# Patient Record
Sex: Female | Born: 1970
Health system: Southern US, Community
[De-identification: ages and names within clinical notes are randomized; demographics above are authoritative.]

## PROBLEM LIST (undated history)

## (undated) DIAGNOSIS — T7840XA Allergy, unspecified, initial encounter: Secondary | ICD-10-CM

## (undated) HISTORY — PX: BLADDER SURGERY: SHX569

## (undated) HISTORY — PX: ABDOMINAL HYSTERECTOMY: SHX81

## (undated) HISTORY — DX: Allergy, unspecified, initial encounter: T78.40XA

## (undated) HISTORY — PX: CHOLECYSTECTOMY: SHX55

## (undated) HISTORY — PX: OTHER SURGICAL HISTORY: SHX169

---

## 2010-01-10 ENCOUNTER — Ambulatory Visit: Payer: Self-pay | Admitting: Unknown Physician Specialty

## 2010-03-02 ENCOUNTER — Ambulatory Visit: Payer: Self-pay | Admitting: Unknown Physician Specialty

## 2010-05-31 ENCOUNTER — Ambulatory Visit: Payer: Self-pay | Admitting: Unknown Physician Specialty

## 2010-06-13 ENCOUNTER — Inpatient Hospital Stay: Payer: Self-pay | Admitting: Obstetrics & Gynecology

## 2010-06-14 LAB — PATHOLOGY REPORT

## 2013-12-21 LAB — HM PAP SMEAR

## 2018-03-13 ENCOUNTER — Encounter: Payer: Self-pay | Admitting: Maternal Newborn

## 2018-03-14 ENCOUNTER — Ambulatory Visit (INDEPENDENT_AMBULATORY_CARE_PROVIDER_SITE_OTHER): Payer: BLUE CROSS/BLUE SHIELD | Admitting: Maternal Newborn

## 2018-03-14 ENCOUNTER — Other Ambulatory Visit (HOSPITAL_COMMUNITY)
Admission: RE | Admit: 2018-03-14 | Discharge: 2018-03-14 | Disposition: A | Discharge status: Home / Self Care | Payer: BLUE CROSS/BLUE SHIELD | Source: Ambulatory Visit | Attending: Maternal Newborn | Admitting: Maternal Newborn

## 2018-03-14 ENCOUNTER — Encounter: Payer: Self-pay | Admitting: Maternal Newborn

## 2018-03-14 VITALS — BP 108/76 | HR 96 | Ht 62.0 in | Wt 162.0 lb

## 2018-03-14 DIAGNOSIS — R5383 Other fatigue: Secondary | ICD-10-CM | POA: Diagnosis not present

## 2018-03-14 DIAGNOSIS — Z1329 Encounter for screening for other suspected endocrine disorder: Secondary | ICD-10-CM | POA: Diagnosis not present

## 2018-03-14 DIAGNOSIS — Z124 Encounter for screening for malignant neoplasm of cervix: Secondary | ICD-10-CM | POA: Insufficient documentation

## 2018-03-14 DIAGNOSIS — Z01419 Encounter for gynecological examination (general) (routine) without abnormal findings: Secondary | ICD-10-CM

## 2018-03-14 DIAGNOSIS — N760 Acute vaginitis: Secondary | ICD-10-CM

## 2018-03-14 DIAGNOSIS — Z130 Encounter for screening for diseases of the blood and blood-forming organs and certain disorders involving the immune mechanism: Secondary | ICD-10-CM | POA: Diagnosis not present

## 2018-03-14 DIAGNOSIS — Z1231 Encounter for screening mammogram for malignant neoplasm of breast: Secondary | ICD-10-CM

## 2018-03-14 NOTE — Progress Notes (Signed)
Gynecology Annual Exam  PCP: Patient, No Pcp Per  Chief Complaint:  Chief Complaint  Patient presents with  . Gynecologic Exam    hot flashes, discharge, no odor, very itchy and burning running to rectal x months ago    History of Present Illness: Patient is a 48 y.o. presenting for an  annual exam. She has had some discharge and itching and painful intercourse.  LMP: No LMP recorded. (Menstrual status: Other). She has had a hysterectomy. No vaginal bleeding.  The patient is sexually active. She currently uses status post hysterectomy for contraception. She has dyspareunia.  The patient does perform self breast exams.  There is no notable family history of breast or ovarian cancer in her family.  The patient has regular exercise: no.    The patient reports current symptoms of depression.    Review of Systems  Constitutional: Positive for malaise/fatigue.  HENT: Negative.   Eyes: Negative.   Respiratory: Negative for shortness of breath and wheezing.   Cardiovascular: Negative for chest pain and palpitations.  Gastrointestinal: Negative.   Genitourinary:       Dyspareunia Vaginal discharge  Musculoskeletal: Positive for joint pain and myalgias.  Skin: Positive for itching.  Neurological: Positive for tingling and headaches.       Sciatic nerve pain on the left side with tingling in her feet/toes  Endo/Heme/Allergies: Positive for environmental allergies. Bruises/bleeds easily.       Hot flashes  Psychiatric/Behavioral: Positive for depression. The patient is not nervous/anxious.   All other systems reviewed and are negative.   Past Medical History:  History reviewed. No pertinent past medical history.  Past Surgical History:  Past Surgical History:  Procedure Laterality Date  . BLADDER SURGERY     bladder sling  . CHOLECYSTECTOMY    . OTHER SURGICAL HISTORY     Hysterectomy, just uterus removed;heavy bleeding    Gynecologic History:  No LMP recorded.  (Menstrual status: Other). Contraception: status post hysterectomy Last Pap: 12/21/2013. Results were: NILM Last mammogram: Unknown, record shows 02/2010 BI-RADS-2 Obstetric History: No obstetric history on file.  Family History:  Family History  Problem Relation Age of Onset  . Depression Mother   . Cancer Father 71       Family history of lung cancer  . Colon polyps Sister   . Depression Sister   . Cancer Maternal Grandfather 16       Family history of lung cancer    Social History:  Social History   Socioeconomic History  . Marital status: Married    Spouse name: Not on file  . Number of children: Not on file  . Years of education: Not on file  . Highest education level: Not on file  Occupational History  . Not on file  Social Needs  . Financial resource strain: Not on file  . Food insecurity:    Worry: Not on file    Inability: Not on file  . Transportation needs:    Medical: Not on file    Non-medical: Not on file  Tobacco Use  . Smoking status: Never Smoker  . Smokeless tobacco: Never Used  Substance and Sexual Activity  . Alcohol use: Yes    Comment: occ  . Drug use: Never  . Sexual activity: Yes    Birth control/protection: Surgical    Comment: partial hysterectomy  Lifestyle  . Physical activity:    Days per week: Not on file    Minutes per session: Not on file  .  Stress: Not on file  Relationships  . Social connections:    Talks on phone: Not on file    Gets together: Not on file    Attends religious service: Not on file    Active member of club or organization: Not on file    Attends meetings of clubs or organizations: Not on file    Relationship status: Not on file  . Intimate partner violence:    Fear of current or ex partner: Not on file    Emotionally abused: Not on file    Physically abused: Not on file    Forced sexual activity: Not on file  Other Topics Concern  . Not on file  Social History Narrative  . Not on file     Allergies:  No Known Allergies  Medications: Prior to Admission medications   Not on File    Physical Exam Vitals: Blood pressure 108/76, pulse 96, height 5\' 2"  (1.575 m), weight 162 lb (73.5 kg).  General: NAD HEENT: normocephalic, anicteric Thyroid: no enlargement, no palpable nodules Pulmonary: No increased work of breathing, CTAB Cardiovascular: RRR, no murmurs, rubs, or gallops Breast: Breasts symmetrical, no tenderness, no palpable nodules or masses, no skin or nipple retraction present, no nipple discharge.  No axillary or supraclavicular lymphadenopathy. Abdomen: Soft, non-tender, non-distended.  Umbilicus without lesions.  No hepatomegaly, splenomegaly or masses palpable. No evidence of hernia  Genitourinary:  External: Mild erythema on labia minora, otherwise normal  external female genitalia.  Normal urethral meatus, normal  Bartholin's and Skene's glands.    Vagina: Normal vaginal mucosa, no evidence of prolapse.  No abnormal discharge seen.   Cervix: Unable to visualize  Uterus: Surgically absent  Adnexa: ovaries non-enlarged, no adnexal masses  Rectal: deferred  Lymphatic: no evidence of inguinal lymphadenopathy Extremities: no edema, erythema, or tenderness Neurologic: Grossly intact Psychiatric: mood appropriate, affect full  Assessment: 48 y.o. here for an annual exam, check for BV and yeast infection.  Plan: Problem List Items Addressed This Visit    None    Visit Diagnoses    Women's annual routine gynecological examination    -  Primary   Relevant Orders   CBC (Completed)   TSH (Completed)   Screening for iron deficiency anemia       Relevant Orders   CBC (Completed)   Fatigue, unspecified type       Relevant Orders   TSH (Completed)   Screening for thyroid disorder       Relevant Orders   TSH (Completed)   Breast cancer screening by mammogram       Relevant Orders   MM 3D SCREEN BREAST BILATERAL   Pap smear for cervical cancer screening        Relevant Orders   Cytology - PAP   Acute vaginitis       Relevant Orders   Cytology - PAP      1) Mammogram - recommend yearly screening mammogram.  Mammogram was ordered today. She is aware to schedule with  [facility]; card given.  2) STI screening was offered and declined.  3) ASCCP guidelines and rationale discussed.  Patient opts for every 3 year screening interval.  4) Contraception - She has had a hysterectomy.  5) Colonoscopy -- Plans to see a PCP and declines referral today for colonoscopy.  6) CBC and TSH ordered today for frequent fatigue.  7) She has a history of depression, not currently on medication and feels that her symptoms are under control. Is  not interested in interventions at this time.  8) Check for yeast and BV with Pap.  Marcelyn BruinsJacelyn Jiro Kiester, CNM 03/14/2018

## 2018-03-15 DIAGNOSIS — M5416 Radiculopathy, lumbar region: Secondary | ICD-10-CM | POA: Diagnosis not present

## 2018-03-15 LAB — CBC
Hematocrit: 39.1 % (ref 34.0–46.6)
Hemoglobin: 13.8 g/dL (ref 11.1–15.9)
MCH: 30.1 pg (ref 26.6–33.0)
MCHC: 35.3 g/dL (ref 31.5–35.7)
MCV: 85 fL (ref 79–97)
PLATELETS: 228 10*3/uL (ref 150–450)
RBC: 4.58 x10E6/uL (ref 3.77–5.28)
RDW: 12.5 % (ref 11.7–15.4)
WBC: 8.2 10*3/uL (ref 3.4–10.8)

## 2018-03-15 LAB — TSH: TSH: 2.15 u[IU]/mL (ref 0.450–4.500)

## 2018-03-17 ENCOUNTER — Encounter: Payer: Self-pay | Admitting: Maternal Newborn

## 2018-03-18 LAB — CYTOLOGY - PAP
BACTERIAL VAGINITIS: POSITIVE — AB
Candida vaginitis: NEGATIVE
DIAGNOSIS: NEGATIVE
HPV: NOT DETECTED

## 2018-03-19 ENCOUNTER — Other Ambulatory Visit: Payer: Self-pay | Admitting: Maternal Newborn

## 2018-03-19 DIAGNOSIS — B9689 Other specified bacterial agents as the cause of diseases classified elsewhere: Secondary | ICD-10-CM

## 2018-03-19 DIAGNOSIS — N760 Acute vaginitis: Principal | ICD-10-CM

## 2018-03-19 MED ORDER — METRONIDAZOLE 500 MG PO TABS: 500.0000 mg | ORAL_TABLET | Freq: Two times a day (BID) | ORAL | 0 refills | Status: AC

## 2018-03-19 NOTE — Progress Notes (Signed)
Rx sent for Flagyl

## 2018-04-04 ENCOUNTER — Other Ambulatory Visit: Payer: Self-pay | Admitting: Maternal Newborn

## 2018-04-04 DIAGNOSIS — B379 Candidiasis, unspecified: Secondary | ICD-10-CM

## 2018-04-04 MED ORDER — FLUCONAZOLE 150 MG PO TABS: 150.0000 mg | ORAL_TABLET | Freq: Once | ORAL | 0 refills | Status: AC

## 2018-04-04 NOTE — Progress Notes (Signed)
Rx for Diflucan 

## 2018-04-28 DIAGNOSIS — M5416 Radiculopathy, lumbar region: Secondary | ICD-10-CM | POA: Diagnosis not present

## 2018-05-02 DIAGNOSIS — M6281 Muscle weakness (generalized): Secondary | ICD-10-CM | POA: Diagnosis not present

## 2018-05-02 DIAGNOSIS — M545 Low back pain: Secondary | ICD-10-CM | POA: Diagnosis not present

## 2018-05-06 DIAGNOSIS — M545 Low back pain: Secondary | ICD-10-CM | POA: Diagnosis not present

## 2018-05-06 DIAGNOSIS — M6281 Muscle weakness (generalized): Secondary | ICD-10-CM | POA: Diagnosis not present

## 2018-09-02 ENCOUNTER — Other Ambulatory Visit: Payer: Self-pay | Admitting: Maternal Newborn

## 2018-09-02 DIAGNOSIS — L290 Pruritus ani: Secondary | ICD-10-CM

## 2018-09-02 MED ORDER — HYDROCORTISONE (PERIANAL) 1 % EX CREA: 1.0000 "application " | TOPICAL_CREAM | Freq: Two times a day (BID) | CUTANEOUS | 0 refills | Status: DC

## 2018-09-02 NOTE — Progress Notes (Signed)
Rx for hydrocortisone cream.

## 2018-09-03 ENCOUNTER — Other Ambulatory Visit: Payer: Self-pay | Admitting: Maternal Newborn

## 2018-09-03 DIAGNOSIS — L290 Pruritus ani: Secondary | ICD-10-CM

## 2018-09-03 MED ORDER — TRIAMCINOLONE ACETONIDE 0.1 % EX CREA: 1.0000 "application " | TOPICAL_CREAM | Freq: Two times a day (BID) | CUTANEOUS | 1 refills | Status: DC

## 2019-01-06 ENCOUNTER — Other Ambulatory Visit: Payer: Self-pay | Admitting: Maternal Newborn

## 2019-01-06 DIAGNOSIS — Z1231 Encounter for screening mammogram for malignant neoplasm of breast: Secondary | ICD-10-CM

## 2019-02-17 ENCOUNTER — Other Ambulatory Visit: Payer: Self-pay

## 2019-02-17 ENCOUNTER — Ambulatory Visit (INDEPENDENT_AMBULATORY_CARE_PROVIDER_SITE_OTHER): Payer: BLUE CROSS/BLUE SHIELD | Admitting: Internal Medicine

## 2019-02-17 ENCOUNTER — Encounter: Payer: Self-pay | Admitting: Internal Medicine

## 2019-02-17 VITALS — BP 108/76 | HR 67 | Temp 97.6°F | Ht 61.0 in | Wt 157.0 lb

## 2019-02-17 DIAGNOSIS — K219 Gastro-esophageal reflux disease without esophagitis: Secondary | ICD-10-CM

## 2019-02-17 DIAGNOSIS — F339 Major depressive disorder, recurrent, unspecified: Secondary | ICD-10-CM

## 2019-02-17 DIAGNOSIS — L409 Psoriasis, unspecified: Secondary | ICD-10-CM

## 2019-02-17 DIAGNOSIS — R8290 Unspecified abnormal findings in urine: Secondary | ICD-10-CM

## 2019-02-17 DIAGNOSIS — N390 Urinary tract infection, site not specified: Secondary | ICD-10-CM | POA: Diagnosis not present

## 2019-02-17 DIAGNOSIS — J302 Other seasonal allergic rhinitis: Secondary | ICD-10-CM | POA: Diagnosis not present

## 2019-02-17 DIAGNOSIS — M5432 Sciatica, left side: Secondary | ICD-10-CM | POA: Insufficient documentation

## 2019-02-17 LAB — POC URINALSYSI DIPSTICK (AUTOMATED)
Bilirubin, UA: NEGATIVE
Blood, UA: NEGATIVE
Glucose, UA: NEGATIVE
Ketones, UA: POSITIVE
Leukocytes, UA: NEGATIVE
Nitrite, UA: POSITIVE
Protein, UA: POSITIVE — AB
Spec Grav, UA: >=1.03 — AB (ref 1.010–1.025)
Urobilinogen, UA: 0.2 E.U./dL
pH, UA: 5 (ref 5.0–8.0)

## 2019-02-17 MED ORDER — NITROFURANTOIN MONOHYD MACRO 100 MG PO CAPS: 100.0000 mg | ORAL_CAPSULE | Freq: Two times a day (BID) | ORAL | 0 refills | Status: DC

## 2019-02-17 MED ORDER — CLOBETASOL PROPIONATE 0.05 % EX CREA: 1.0000 "application " | TOPICAL_CREAM | Freq: Two times a day (BID) | CUTANEOUS | 0 refills | Status: DC

## 2019-02-17 MED ORDER — CLOBETASOL PROPIONATE 0.05 % EX SOLN: 1.0000 "application " | Freq: Every day | CUTANEOUS | 4 refills | Status: DC

## 2019-02-17 MED ORDER — SERTRALINE HCL 50 MG PO TABS: 50.0000 mg | ORAL_TABLET | Freq: Every day | ORAL | 2 refills | Status: DC

## 2019-02-17 NOTE — Assessment & Plan Note (Signed)
PHQ9 score of 3 Support offered today Will restart Sertraline 50 mg PO daily  Update me in 4-6 weeks via mychart and let me know how you are doing

## 2019-02-17 NOTE — Assessment & Plan Note (Signed)
RX for Clobetasol 0.05% cream BID RX for Clobetasol 0.05% external solution for scalp

## 2019-02-17 NOTE — Assessment & Plan Note (Signed)
Try to avoid foods that trigger your reflux, and eating late at night Continue Famotidine OTC as needed

## 2019-02-17 NOTE — Assessment & Plan Note (Signed)
Continue Zyrtec and Flonase OTC

## 2019-02-17 NOTE — Progress Notes (Signed)
HPI  Pt presents to the clinic today to establish care and for management of the conditions listed below. She has not had a PCP in many years, but has been following with GYN:  Seasonal Allergies: Worse in the Spring. She takes Zyrtec and Flonase as needed with good relief.  GERD: Triggered by eating late at night, onions, spicy and tomato based foods. She takes Famotidine OTC as needed with good relief. There is no upper GI on file.   She also c/o rash to scalp, back, upper chest, left elbow, vaginal area/perineum. These areas itch and then hurt after scratching. She has tried Hydrocortisone and Triamcinolone cream without any relief.   Depression: She feels like this is being triggered by having a difficult year. She was really sick with a cold back in the fall. Then she developed shingles, then this rash that won't go away. She also reports having issues with her sciatic nerve. She reports she was on Sertraline in the past, but went off of it secondary decreased libido. She would like to restart Sertraline at this time. She is not currently seeing a therapist. She denies anxiety, SI/HI.  Sciatica: Left side. She reports some numbness in her toes. She stretches daily and takes NSAID's OTC. She was doing PT in the past which helped but stopped due to the expense.  Flu: never Tetanus: > 10 years ago Pap Smear: 03/2018, [facility] Mammogram: scheduled 04/2019 at [facility] Colon Screening: never Vision Screening: as needed Dentist: as needed  Past Medical History:  Diagnosis Date  . Allergy     Current Outpatient Medications  Medication Sig Dispense Refill  . cetirizine (ZYRTEC) 10 MG tablet Take 10 mg by mouth daily.    . famotidine (PEPCID) 20 MG tablet Take 20 mg by mouth as needed.     Marland Kitchen ibuprofen (ADVIL) 200 MG tablet Take 200 mg by mouth daily as needed.    . naproxen sodium (ALEVE) 220 MG tablet Take 220 mg by mouth daily as needed.     No current facility-administered medications  for this visit.    No Known Allergies  Family History  Problem Relation Age of Onset  . Depression Mother   . Alzheimer's disease Mother   . Lung cancer Father   . Colon polyps Sister   . Depression Sister   . Lung cancer Maternal Grandfather   . Depression Sister     Social History   Socioeconomic History  . Marital status: Married    Spouse name: Not on file  . Number of children: Not on file  . Years of education: Not on file  . Highest education level: Not on file  Occupational History  . Not on file  Tobacco Use  . Smoking status: Current Some Day Smoker    Types: Cigarettes  . Smokeless tobacco: Never Used  Substance and Sexual Activity  . Alcohol use: Yes    Comment: occasional  . Drug use: Never  . Sexual activity: Yes    Birth control/protection: Surgical    Comment: partial hysterectomy  Other Topics Concern  . Not on file  Social History Narrative  . Not on file   Social Determinants of Health   Financial Resource Strain:   . Difficulty of Paying Living Expenses: Not on file  Food Insecurity:   . Worried About Programme researcher, broadcasting/film/video in the Last Year: Not on file  . Ran Out of Food in the Last Year: Not on file  Transportation Needs:   .  Lack of Transportation (Medical): Not on file  . Lack of Transportation (Non-Medical): Not on file  Physical Activity:   . Days of Exercise per Week: Not on file  . Minutes of Exercise per Session: Not on file  Stress:   . Feeling of Stress : Not on file  Social Connections:   . Frequency of Communication with Friends and Family: Not on file  . Frequency of Social Gatherings with Friends and Family: Not on file  . Attends Religious Services: Not on file  . Active Member of Clubs or Organizations: Not on file  . Attends Archivist Meetings: Not on file  . Marital Status: Not on file  Intimate Partner Violence:   . Fear of Current or Ex-Partner: Not on file  . Emotionally Abused: Not on file  .  Physically Abused: Not on file  . Sexually Abused: Not on file    ROS:  Constitutional: Pt reports intermittent sinus headaches. Denies fever, malaise, fatigue, or abrupt weight changes.  HEENT: Denies eye pain, eye redness, ear pain, ringing in the ears, wax buildup, runny nose, nasal congestion, bloody nose, or sore throat. Respiratory: Denies difficulty breathing, shortness of breath, cough or sputum production.   Cardiovascular: Denies chest pain, chest tightness, palpitations or swelling in the hands or feet.  Gastrointestinal: Denies abdominal pain, bloating, constipation, diarrhea or blood in the stool.  GU: Pt reports urine odor. Denies frequency, urgency, pain with urination, blood in urine, or discharge. Musculoskeletal: Pt reports chronic sciatica. Denies decrease in range of motion, difficulty with gait, muscle pain or joint pain and swelling.  Skin: Pt reports rash. Denies ulcercations.  Neurological: Denies dizziness, difficulty with memory, difficulty with speech or problems with balance and coordination.  Psych: Pt reports depression. Denies anxiety, SI/HI.  No other specific complaints in a complete review of systems (except as listed in HPI above).  PE:   BP 108/76   Pulse 67   Temp 97.6 F (36.4 C) (Temporal)   Ht 5\' 1"  (1.549 m)   Wt 157 lb (71.2 kg)   SpO2 98%   BMI 29.66 kg/m   Wt Readings from Last 3 Encounters:  03/14/18 162 lb (73.5 kg)    General: Appears her stated age, well developed, well nourished in NAD. Skin: White scaly plaque note of posterior scalp, right upper back and left elbow. She also reports similar rash of the vaginal/perineal area. HEENT: Head: normal shape and size; Eyes: sclera white, no icterus, conjunctiva pink, PERRLA and EOMs intact;  Cardiovascular: Normal rate and rhythm. S1,S2 noted.  No murmur, rubs or gallops noted.  Pulmonary/Chest: Normal effort and positive vesicular breath sounds. No respiratory distress. No wheezes,  rales or ronchi noted.  Abdomen: Soft and nontender. Normal bowel sounds. Musculoskeletal: No difficulty with gait.  Neurological: Alert and oriented.  Psychiatric: Mood and affect normal. Behavior is normal. Judgment and thought content normal.     Assessment and Plan:  Urine Odor:  Urinalysis: trace leuks, pos nitrites Will send urine culture Push fluids Ok to take AZO OTC RX for Macrobid 100 mg BID x 5 days  Webb Silversmith, NP This visit occurred during the SARS-CoV-2 public health emergency.  Safety protocols were in place, including screening questions prior to the visit, additional usage of staff PPE, and extensive cleaning of exam room while observing appropriate contact time as indicated for disinfecting solutions.

## 2019-02-17 NOTE — Assessment & Plan Note (Signed)
Continue stretching Continue NSAID's OTC Consider seeing a chiropractor

## 2019-02-17 NOTE — Addendum Note (Signed)
Addended by: Lurlean Nanny on: 02/17/2019 09:52 AM   Modules accepted: Orders

## 2019-02-17 NOTE — Patient Instructions (Signed)
Psoriasis Psoriasis is a long-term (chronic) skin condition. It occurs because your body's defense system (immune system) causes skin cells to form too quickly. This causes raised, red patches (plaques) on your skin that look silvery. The patches may be on all areas of your body. They can be any size or shape. Psoriasis can come and go. It can range from mild to very bad. It cannot be passed from one person to another (is not contagious). There is no cure for this condition, but it can be helped with treatment. What are the causes? The cause of psoriasis is not known. Some things can make it worse. These are:  Skin damage, such as cuts, scrapes, sunburn, and dryness.  Not getting enough sunlight.  Some medicines.  Alcohol.  Tobacco.  Stress.  Infections. What increases the risk?  Having a family member with psoriasis.  Being very overweight (obese).  Being 20-40 years old.  Taking certain medicines. What are the signs or symptoms? There are different types of psoriasis. The types are:  Plaque. This is the most common. Symptoms include red, raised patches with a silvery coating. These may be itchy. Your nails may be crumbly or fall off.  Guttate. Symptoms include small red spots on your stomach area, arms, and legs. These may happen after you have been sick, such as with strep throat.  Inverse. Symptoms include patches in your armpits, under your breasts, private areas, or on your butt.  Pustular. Symptoms include pus-filled bumps on the palms of your hands or the soles of your feet. You also may feel very tired, weak, have a fever, and not be hungry.  Erythrodermic. Symptoms include bright red skin that looks burned. You may have a fast heartbeat and a body temperature that is too high or too low. You may be itchy or in pain.  Sebopsoriasis. Symptoms include red patches on your scalp, forehead, and face that are greasy.  Psoriatic arthritis. Symptoms include swollen,  painful joints along with scaly skin patches. How is this treated? There is no cure for this condition, but treatment can:  Help your skin heal.  Lessen itching and irritation and swelling (inflammation).  Slow the growth of new skin cells.  Help your body's defense system respond better to your skin. Treatment may include:  Creams or ointments.  Light therapy. This may include natural sunlight or light therapy in a doctor's office.  Medicines. These can help your body better manage skin cells. They may be used with light therapy or ointments. Medicines may include pills or injections. You may also get antibiotic medicines if you have an infection. Follow these instructions at home: Skin Care  Apply lotion to your skin as needed. Only use those that your doctor has said are okay.  Apply cool, wet cloths (cold compresses) to the affected areas.  Do not use a hot tub or take hot showers. Use slightly warm, not hot, water when taking showers and baths.  Do not scratch your skin. Lifestyle   Do not use any products that contain nicotine or tobacco, such as cigarettes, e-cigarettes, and chewing tobacco. If you need help quitting, ask your doctor.  Lower your stress.  Keep a healthy weight.  Go out in the sun as told by your doctor. Do not get sunburned.  Join a support group. Medicines  Take or use over-the-counter and prescription medicines only as told by your doctor.  If you were prescribed an antibiotic medicine, take it as told by your doctor.   Do not stop using the antibiotic even if you start to feel better. Alcohol use If you drink alcohol:  Limit how much you use: ? 0-1 drink a day for women. ? 0-2 drinks a day for men.  Be aware of how much alcohol is in your drink. In the [place] one drink equals one 12 oz bottle of beer (355 mL), one 5 oz glass of wine (148 mL), or one 1 oz glass of hard liquor (44 mL). General instructions  Keep a journal to track the  things that cause symptoms (triggers). Try to avoid these things.  See a counselor if you feel the support would help.  Keep all follow-up visits as told by your doctor. This is important. Contact a doctor if:  You have a fever.  Your pain gets worse.  You have more redness or warmth in the affected areas.  You have new or worse pain or stiffness in your joints.  Your nails start to break easily or pull away from the nail bed.  You feel very sad (depressed). Summary  Psoriasis is a long-term (chronic) skin condition.  There is no cure for this condition, but treatment can help manage it.  Keep a journal to track the things that cause symptoms.  Take or use over-the-counter and prescription medicines only as told by your doctor.  Keep all follow-up visits as told by your doctor. This is important. This information is not intended to replace advice given to you by your health care provider. Make sure you discuss any questions you have with your health care provider. Document Released: 03/29/2004 Document Revised: 12/24/2017 Document Reviewed: 12/24/2017 Elsevier Patient Education  2020 [employer]  

## 2019-02-19 LAB — URINE CULTURE
MICRO NUMBER:: 1199393
SPECIMEN QUALITY:: ADEQUATE

## 2019-05-02 ENCOUNTER — Other Ambulatory Visit: Payer: Self-pay | Admitting: Internal Medicine

## 2019-05-04 MED ORDER — CLOBETASOL PROPIONATE 0.05 % EX CREA: 1.0000 "application " | TOPICAL_CREAM | Freq: Two times a day (BID) | CUTANEOUS | 0 refills | Status: DC

## 2019-05-18 ENCOUNTER — Ambulatory Visit: Payer: BLUE CROSS/BLUE SHIELD | Admitting: Internal Medicine

## 2019-05-25 ENCOUNTER — Encounter: Payer: Self-pay | Admitting: Internal Medicine

## 2019-05-25 ENCOUNTER — Ambulatory Visit: Payer: BLUE CROSS/BLUE SHIELD | Admitting: Internal Medicine

## 2019-07-01 ENCOUNTER — Encounter: Payer: Self-pay | Admitting: Internal Medicine

## 2019-07-22 ENCOUNTER — Ambulatory Visit (INDEPENDENT_AMBULATORY_CARE_PROVIDER_SITE_OTHER): Payer: BC Managed Care – PPO | Admitting: Internal Medicine

## 2019-07-22 ENCOUNTER — Encounter: Payer: Self-pay | Admitting: Internal Medicine

## 2019-07-22 ENCOUNTER — Other Ambulatory Visit: Payer: Self-pay

## 2019-07-22 VITALS — BP 116/70 | HR 100 | Temp 97.8°F | Ht 61.0 in | Wt 156.0 lb

## 2019-07-22 DIAGNOSIS — R198 Other specified symptoms and signs involving the digestive system and abdomen: Secondary | ICD-10-CM

## 2019-07-22 DIAGNOSIS — Z0000 Encounter for general adult medical examination without abnormal findings: Secondary | ICD-10-CM | POA: Diagnosis not present

## 2019-07-22 DIAGNOSIS — F419 Anxiety disorder, unspecified: Secondary | ICD-10-CM

## 2019-07-22 DIAGNOSIS — Z1211 Encounter for screening for malignant neoplasm of colon: Secondary | ICD-10-CM

## 2019-07-22 DIAGNOSIS — F339 Major depressive disorder, recurrent, unspecified: Secondary | ICD-10-CM | POA: Diagnosis not present

## 2019-07-22 DIAGNOSIS — F329 Major depressive disorder, single episode, unspecified: Secondary | ICD-10-CM | POA: Diagnosis not present

## 2019-07-22 MED ORDER — BUPROPION HCL ER (XL) 150 MG PO TB24: 150.0000 mg | ORAL_TABLET | Freq: Every day | ORAL | 0 refills | Status: DC

## 2019-07-22 NOTE — Progress Notes (Signed)
Subjective:    Patient ID: Rebecca Buchanan, female    DOB: Feb 05, 1971, 49 y.o.   MRN: [id number]  HPI  Pt presents to the clinic today for her annual exam.  Anxiety and Depression: She reports she stopped Sertraline due to side effects. She does feel like her anxiety has been worse recently. She has failed Citalopram, Venlafaxine in the past. She is not seeing a therapist. She denies SI/HI.  Flu: never Tetanus: > 10 years ago Covid: never Pap Smear: 03/2018 Mammogram: 2015 Colon Screening: never Vision Screening: as needed Dentist: as needed  Diet: She does eat meat. She consumes fruits and veggies daily. She does eat some fried foods. She drinks mostly sweet tea, some water. Exercise: Walking  Review of Systems  Past Medical History:  Diagnosis Date  . Allergy     Current Outpatient Medications  Medication Sig Dispense Refill  . cetirizine (ZYRTEC) 10 MG tablet Take 10 mg by mouth daily.    . clobetasol (TEMOVATE) 0.05 % external solution Apply 1 application topically daily. 240 mL 4  . clobetasol cream (TEMOVATE) 0.05 % Apply 1 application topically 2 (two) times daily. 30 g 0  . famotidine (PEPCID) 20 MG tablet Take 20 mg by mouth as needed.     Marland Kitchen ibuprofen (ADVIL) 200 MG tablet Take 200 mg by mouth daily as needed.    . naproxen sodium (ALEVE) 220 MG tablet Take 220 mg by mouth daily as needed.    . nitrofurantoin, macrocrystal-monohydrate, (MACROBID) 100 MG capsule Take 1 capsule (100 mg total) by mouth 2 (two) times daily. 10 capsule 0  . sertraline (ZOLOFT) 50 MG tablet Take 1 tablet (50 mg total) by mouth daily. 30 tablet 2   No current facility-administered medications for this visit.    No Known Allergies  Family History  Problem Relation Age of Onset  . Depression Mother   . Alzheimer's disease Mother   . Lung cancer Father   . Colon polyps Sister   . Depression Sister   . Lung cancer Maternal Grandfather   . Depression Sister     Social History    Socioeconomic History  . Marital status: Married    Spouse name: Not on file  . Number of children: Not on file  . Years of education: Not on file  . Highest education level: Not on file  Occupational History  . Not on file  Tobacco Use  . Smoking status: Current Some Day Smoker    Types: Cigarettes  . Smokeless tobacco: Never Used  Substance and Sexual Activity  . Alcohol use: Yes    Comment: occasional  . Drug use: Never  . Sexual activity: Yes    Birth control/protection: Surgical    Comment: partial hysterectomy  Other Topics Concern  . Not on file  Social History Narrative  . Not on file   Social Determinants of Health   Financial Resource Strain:   . Difficulty of Paying Living Expenses:   Food Insecurity:   . Worried About Programme researcher, broadcasting/film/video in the Last Year:   . Barista in the Last Year:   Transportation Needs:   . Freight forwarder (Medical):   Marland Kitchen Lack of Transportation (Non-Medical):   Physical Activity:   . Days of Exercise per Week:   . Minutes of Exercise per Session:   Stress:   . Feeling of Stress :   Social Connections:   . Frequency of Communication with Friends and  Family:   . Frequency of Social Gatherings with Friends and Family:   . Attends Religious Services:   . Active Member of Clubs or Organizations:   . Attends Archivist Meetings:   Marland Kitchen Marital Status:   Intimate Partner Violence:   . Fear of Current or Ex-Partner:   . Emotionally Abused:   Marland Kitchen Physically Abused:   . Sexually Abused:      Constitutional: Pt reports sinus headaches. Denies fever, malaise, fatigue, or abrupt weight changes.  HEENT: Denies eye pain, eye redness, ear pain, ringing in the ears, wax buildup, runny nose, nasal congestion, bloody nose, or sore throat. Respiratory: Denies difficulty breathing, shortness of breath, cough or sputum production.   Cardiovascular: Denies chest pain, chest tightness, palpitations or swelling in the hands or  feet.  Gastrointestinal: Pt reports alternating constipation and diarrhea. Denies abdominal pain, diarrhea or blood in the stool.  GU: Denies urgency, frequency, pain with urination, burning sensation, blood in urine, odor or discharge. Musculoskeletal: Denies decrease in range of motion, difficulty with gait, muscle pain or joint pain and swelling.  Skin: Denies redness, rashes, lesions or ulcercations.  Neurological: Denies dizziness, difficulty with memory, difficulty with speech or problems with balance and coordination.  Psych: Pt has a history of anxiety and depression. Denies SI/HI.  No other specific complaints in a complete review of systems (except as listed in HPI above).     Objective:   Physical Exam BP 116/70   Pulse 100   Temp 97.8 F (36.6 C) (Temporal)   Ht 5\' 1"  (1.549 m)   Wt 156 lb (70.8 kg)   SpO2 98%   BMI 29.48 kg/m   Wt Readings from Last 3 Encounters:  02/17/19 157 lb (71.2 kg)  03/14/18 162 lb (73.5 kg)    General: Appears her stated age, well developed, well nourished in NAD. Skin: Warm, dry and intact. No rashesnoted. HEENT: Head: normal shape and size; Eyes: sclera white, no icterus, conjunctiva pink, PERRLA and EOMs intact;  Neck:  Neck supple, trachea midline. No masses, lumps or thyromegaly present.  Cardiovascular: Normal rate and rhythm. S1,S2 noted.  No murmur, rubs or gallops noted. No JVD or BLE edema. No carotid bruits noted. Pulmonary/Chest: Normal effort and positive vesicular breath sounds. No respiratory distress. No wheezes, rales or ronchi noted.  Abdomen: Soft and nontender. Normal bowel sounds. No distention or masses noted. Liver, spleen and kidneys non palpable. Musculoskeletal: Strength 5/5 BUE/BLE. No difficulty with gait.  Neurological: Alert and oriented. Cranial nerves II-XII grossly intact. Coordination normal.  Psychiatric: Mood and affect normal. Behavior is normal. Judgment and thought content normal.            Assessment & Plan:   Preventative Health Maintenance:  Encouraged her to get a flu shot in the fall Tdap today Pap smear UTD Mammogram has been ordered- she will call to schedule Referral to GI for screening colonoscopy Encouraged her to consume a balanced diet and exercise regimen Advised her to see an eye doctor and dentist annually Will check CBC, CMET, Lipid and Vit D today  RTC in 1 year, sooner if needed Webb Silversmith, NP This visit occurred during the SARS-CoV-2 public health emergency.  Safety protocols were in place, including screening questions prior to the visit, additional usage of staff PPE, and extensive cleaning of exam room while observing appropriate contact time as indicated for disinfecting solutions.

## 2019-07-22 NOTE — Assessment & Plan Note (Signed)
Will d/c Sertraline Will trial Wellbutrin, RX sent to pharmacy Support offered  Update me in 1 month via mychart and let me know how you are doing

## 2019-07-22 NOTE — Patient Instructions (Signed)

## 2019-07-22 NOTE — Addendum Note (Signed)
Addended by: Roena Malady on: 07/22/2019 03:59 PM   Modules accepted: Orders

## 2019-07-23 LAB — LIPID PANEL
Cholesterol: 264 mg/dL — ABNORMAL HIGH (ref 0–200)
HDL: 75.5 mg/dL (ref >39.00)
LDL Cholesterol: 168 mg/dL — ABNORMAL HIGH (ref 0–99)
NonHDL: 188.83
Total CHOL/HDL Ratio: 4
Triglycerides: 104 mg/dL (ref 0.0–149.0)
VLDL: 20.8 mg/dL (ref 0.0–40.0)

## 2019-07-23 LAB — CBC
HCT: 38.9 % (ref 36.0–46.0)
Hemoglobin: 13.3 g/dL (ref 12.0–15.0)
MCHC: 34.1 g/dL (ref 30.0–36.0)
MCV: 88.6 fl (ref 78.0–100.0)
Platelets: 205 10*3/uL (ref 150.0–400.0)
RBC: 4.4 Mil/uL (ref 3.87–5.11)
RDW: 13 % (ref 11.5–15.5)
WBC: 7 10*3/uL (ref 4.0–10.5)

## 2019-07-23 LAB — COMPREHENSIVE METABOLIC PANEL
ALT: 24 U/L (ref 0–35)
AST: 24 U/L (ref 0–37)
Albumin: 4.6 g/dL (ref 3.5–5.2)
Alkaline Phosphatase: 98 U/L (ref 39–117)
BUN: 12 mg/dL (ref 6–23)
CO2: 30 mEq/L (ref 19–32)
Calcium: 9.8 mg/dL (ref 8.4–10.5)
Chloride: 105 mEq/L (ref 96–112)
Creatinine, Ser: 0.94 mg/dL (ref 0.40–1.20)
GFR: 62.69 mL/min (ref >60.00)
Glucose, Bld: 92 mg/dL (ref 70–99)
Potassium: 4.1 mEq/L (ref 3.5–5.1)
Sodium: 139 mEq/L (ref 135–145)
Total Bilirubin: 0.4 mg/dL (ref 0.2–1.2)
Total Protein: 7.1 g/dL (ref 6.0–8.3)

## 2019-07-23 LAB — VITAMIN D 25 HYDROXY (VIT D DEFICIENCY, FRACTURES): VITD: 28.91 ng/mL — ABNORMAL LOW (ref 30.00–100.00)

## 2019-07-24 ENCOUNTER — Encounter: Payer: Self-pay | Admitting: Internal Medicine

## 2019-07-28 ENCOUNTER — Encounter: Payer: Self-pay | Admitting: Internal Medicine

## 2019-07-28 MED ORDER — VALACYCLOVIR HCL 500 MG PO TABS: ORAL_TABLET | ORAL | 0 refills | Status: DC

## 2019-09-14 ENCOUNTER — Other Ambulatory Visit: Payer: Self-pay

## 2019-09-14 ENCOUNTER — Emergency Department
Admission: EM | Admit: 2019-09-14 | Discharge: 2019-09-14 | Disposition: A | Discharge status: Home / Self Care | Payer: BC Managed Care – PPO | Attending: Emergency Medicine | Admitting: Emergency Medicine

## 2019-09-14 ENCOUNTER — Encounter: Payer: Self-pay | Admitting: Emergency Medicine

## 2019-09-14 ENCOUNTER — Emergency Department: Payer: BC Managed Care – PPO

## 2019-09-14 DIAGNOSIS — Z9049 Acquired absence of other specified parts of digestive tract: Secondary | ICD-10-CM | POA: Diagnosis not present

## 2019-09-14 DIAGNOSIS — N201 Calculus of ureter: Secondary | ICD-10-CM | POA: Insufficient documentation

## 2019-09-14 DIAGNOSIS — N132 Hydronephrosis with renal and ureteral calculous obstruction: Secondary | ICD-10-CM | POA: Diagnosis not present

## 2019-09-14 DIAGNOSIS — R102 Pelvic and perineal pain: Secondary | ICD-10-CM | POA: Diagnosis not present

## 2019-09-14 DIAGNOSIS — N210 Calculus in bladder: Secondary | ICD-10-CM | POA: Diagnosis not present

## 2019-09-14 LAB — URINALYSIS, COMPLETE (UACMP) WITH MICROSCOPIC: Specific Gravity, Urine: 1.025 (ref 1.005–1.030)

## 2019-09-14 LAB — CBC
HCT: 36.9 % (ref 36.0–46.0)
Hemoglobin: 12.9 g/dL (ref 12.0–15.0)
MCH: 30.1 pg (ref 26.0–34.0)
MCHC: 35 g/dL (ref 30.0–36.0)
MCV: 86 fL (ref 80.0–100.0)
Platelets: 216 10*3/uL (ref 150–400)
RBC: 4.29 MIL/uL (ref 3.87–5.11)
RDW: 13 % (ref 11.5–15.5)
WBC: 11.2 10*3/uL — ABNORMAL HIGH (ref 4.0–10.5)
nRBC: 0 % (ref 0.0–0.2)

## 2019-09-14 LAB — BASIC METABOLIC PANEL
Anion gap: 11 (ref 5–15)
BUN: 13 mg/dL (ref 6–20)
CO2: 24 mmol/L (ref 22–32)
Calcium: 9.2 mg/dL (ref 8.9–10.3)
Chloride: 105 mmol/L (ref 98–111)
Creatinine, Ser: 0.95 mg/dL (ref 0.44–1.00)
GFR calc Af Amer: >60 mL/min (ref >60)
GFR calc non Af Amer: >60 mL/min (ref >60)
Glucose, Bld: 128 mg/dL — ABNORMAL HIGH (ref 70–99)
Potassium: 3.6 mmol/L (ref 3.5–5.1)
Sodium: 140 mmol/L (ref 135–145)

## 2019-09-14 MED ORDER — ONDANSETRON HCL 4 MG/2ML IJ SOLN
4.0000 mg | Freq: Once | INTRAMUSCULAR | Status: AC
  Administered 2019-09-14: 4 mg via INTRAVENOUS
  Filled 2019-09-14: qty 2

## 2019-09-14 MED ORDER — NAPROXEN 500 MG PO TABS
500.0000 mg | ORAL_TABLET | Freq: Two times a day (BID) | ORAL | 2 refills | Status: DC
Start: 2019-09-14 — End: 2021-02-06

## 2019-09-14 MED ORDER — TAMSULOSIN HCL 0.4 MG PO CAPS
0.4000 mg | ORAL_CAPSULE | Freq: Every day | ORAL | 0 refills | Status: DC
Start: 2019-09-14 — End: 2021-02-06

## 2019-09-14 MED ORDER — MORPHINE SULFATE (PF) 2 MG/ML IV SOLN
2.0000 mg | Freq: Once | INTRAVENOUS | Status: AC
  Administered 2019-09-14: 2 mg via INTRAVENOUS
  Filled 2019-09-14: qty 1

## 2019-09-14 MED ORDER — ONDANSETRON 4 MG PO TBDP: 4.0000 mg | ORAL_TABLET | Freq: Three times a day (TID) | ORAL | 0 refills | Status: DC | PRN

## 2019-09-14 MED ORDER — CEPHALEXIN 500 MG PO CAPS: 500.0000 mg | ORAL_CAPSULE | Freq: Two times a day (BID) | ORAL | 0 refills | Status: DC

## 2019-09-14 MED ORDER — HYDROCODONE-ACETAMINOPHEN 5-325 MG PO TABS: 1.0000 | ORAL_TABLET | Freq: Four times a day (QID) | ORAL | 0 refills | Status: DC | PRN

## 2019-09-14 MED ORDER — SODIUM CHLORIDE 0.9 % IV SOLN
1.0000 g | Freq: Once | INTRAVENOUS | Status: AC
  Administered 2019-09-14: 1 g via INTRAVENOUS
  Filled 2019-09-14: qty 10

## 2019-09-14 MED ORDER — OXYCODONE-ACETAMINOPHEN 5-325 MG PO TABS
1.0000 | ORAL_TABLET | Freq: Once | ORAL | Status: AC
  Administered 2019-09-14: 1 via ORAL
  Filled 2019-09-14: qty 1

## 2019-09-14 MED ORDER — KETOROLAC TROMETHAMINE 30 MG/ML IJ SOLN
30.0000 mg | Freq: Once | INTRAMUSCULAR | Status: AC
  Administered 2019-09-14: 30 mg via INTRAVENOUS
  Filled 2019-09-14: qty 1

## 2019-09-14 NOTE — ED Triage Notes (Signed)
Pt in via POV, sent over from [facility] for evaluation of pelvic pain and right flank pain x a few days, worsening today w/ some N/V developing as well.  Pt appears to be in acute pain.    Reports taking AZO and increasing water intake without any relief.

## 2019-09-14 NOTE — ED Notes (Signed)
POCT NEGATIVE  

## 2019-09-14 NOTE — ED Triage Notes (Signed)
First Nurse Note:  Arrives from [facility] for ED evaluation for pelvic and back pain.    Patient is AAOx3.  Skin warm and dry. NAD

## 2019-09-14 NOTE — ED Provider Notes (Signed)
[facility] Emergency Department Provider Note   ____________________________________________    I have reviewed the triage vital signs and the nursing notes.   HISTORY  Chief Complaint Flank Pain and Pelvic Pain     HPI Rebecca Buchanan is a 49 y.o. female who presents with complaints of right flank pain and right lower quadrant pain which she reports was quite severe and at times worse than childbirth.  She also reports over the last week while she was at the beach she had urinary tract infection symptoms, she has had urinary tract infections many times in the past, she took Azo as typical.  Denies fevers or chills.  She became concerned when pain worsened over the last day.  She has had nausea.  Past Medical History:  Diagnosis Date  . Allergy     Patient Active Problem List   Diagnosis Date Noted  . Seasonal allergies 02/17/2019  . GERD (gastroesophageal reflux disease) 02/17/2019  . Psoriasis 02/17/2019  . Depression, recurrent (HCC) 02/17/2019  . Sciatica of left side 02/17/2019    Past Surgical History:  Procedure Laterality Date  . ABDOMINAL HYSTERECTOMY     partial  . BLADDER SURGERY     bladder sling  . CHOLECYSTECTOMY    . OTHER SURGICAL HISTORY     Hysterectomy, just uterus removed;heavy bleeding    Prior to Admission medications   Medication Sig Start Date End Date Taking? Authorizing Provider  buPROPion (WELLBUTRIN XL) 150 MG 24 hr tablet Take 1 tablet (150 mg total) by mouth daily. 07/22/19   Lorre Munroe, NP  cetirizine (ZYRTEC) 10 MG tablet Take 10 mg by mouth daily.    [provider]  clobetasol (TEMOVATE) 0.05 % external solution Apply 1 application topically daily. 02/17/19   Lorre Munroe, NP  clobetasol cream (TEMOVATE) 0.05 % Apply 1 application topically 2 (two) times daily. 05/04/19   Lorre Munroe, NP  famotidine (PEPCID) 20 MG tablet Take 20 mg by mouth as needed.     [provider]    ibuprofen (ADVIL) 200 MG tablet Take 200 mg by mouth daily as needed.    [provider]  naproxen sodium (ALEVE) 220 MG tablet Take 220 mg by mouth daily as needed.    [provider]  valACYclovir (VALTREX) 500 MG tablet 1 tab po BID x 3 days during outbreak 07/28/19   Lorre Munroe, NP     Allergies Patient has no known allergies.  Family History  Problem Relation Age of Onset  . Depression Mother   . Alzheimer's disease Mother   . Lung cancer Father   . Colon polyps Sister   . Depression Sister   . Lung cancer Maternal Grandfather   . Depression Sister     Social History Social History   Tobacco Use  . Smoking status: Never Smoker  . Smokeless tobacco: Never Used  Vaping Use  . Vaping Use: Never used  Substance Use Topics  . Alcohol use: Yes  . Drug use: Never    Review of Systems  Constitutional: No fever Eyes: No visual changes.  ENT: No sore throat. Cardiovascular: Denies chest pain. Respiratory: Denies shortness of breath. Gastrointestinal: As above Genitourinary: As above Musculoskeletal: Negative for back pain. Skin: Negative for rash. Neurological: Negative for headaches or weakness   ____________________________________________   PHYSICAL EXAM:  VITAL SIGNS: ED Triage Vitals  Enc Vitals Group     BP 09/14/19 1829 136/71  Pulse Rate 09/14/19 1829 64     Resp 09/14/19 1829 20     Temp 09/14/19 1829 97.8 F (36.6 C)     Temp Source 09/14/19 1829 Oral     SpO2 09/14/19 1829 100 %     Weight 09/14/19 1829 70.8 kg (156 lb)     Height 09/14/19 1829 1.549 m (5\' 1" )     Head Circumference --      Peak Flow --      Pain Score 09/14/19 1846 9     Pain Loc --      Pain Edu? --      Excl. in GC? --     Constitutional: Alert and oriented.  Nose: No congestion/rhinnorhea. Mouth/Throat: Mucous membranes are moist.    Cardiovascular: Normal rate, regular rhythm.   Good peripheral circulation. Respiratory: Normal  respiratory effort.  No retractions.. Gastrointestinal: Soft and nontender. No distention.  Mild right CVA tenderness  Musculoskeletal: .  Warm and well perfused Neurologic:  Normal speech and language. No gross focal neurologic deficits are appreciated.  Skin:  Skin is warm, dry and intact. No rash noted. Psychiatric: Mood and affect are normal. Speech and behavior are normal.  ____________________________________________   LABS (all labs ordered are listed, but only abnormal results are displayed)  Labs Reviewed  URINALYSIS, COMPLETE (UACMP) WITH MICROSCOPIC - Abnormal; Notable for the following components:      Result Value   Color, Urine ORANGE (*)    APPearance TURBID (*)    Glucose, UA   (*)    Value: TEST NOT REPORTED DUE TO COLOR INTERFERENCE OF URINE PIGMENT   Hgb urine dipstick   (*)    Value: TEST NOT REPORTED DUE TO COLOR INTERFERENCE OF URINE PIGMENT   Bilirubin Urine   (*)    Value: TEST NOT REPORTED DUE TO COLOR INTERFERENCE OF URINE PIGMENT   Ketones, ur   (*)    Value: TEST NOT REPORTED DUE TO COLOR INTERFERENCE OF URINE PIGMENT   Protein, ur   (*)    Value: TEST NOT REPORTED DUE TO COLOR INTERFERENCE OF URINE PIGMENT   Nitrite   (*)    Value: TEST NOT REPORTED DUE TO COLOR INTERFERENCE OF URINE PIGMENT   Leukocytes,Ua   (*)    Value: TEST NOT REPORTED DUE TO COLOR INTERFERENCE OF URINE PIGMENT   Bacteria, UA RARE (*)    All other components within normal limits  BASIC METABOLIC PANEL - Abnormal; Notable for the following components:   Glucose, Bld 128 (*)    All other components within normal limits  CBC - Abnormal; Notable for the following components:   WBC 11.2 (*)    All other components within normal limits   ____________________________________________  EKG  None ____________________________________________  RADIOLOGY  CT renal stone study consistent with 4 to 5 mm distal right ureteral  calculus ____________________________________________   PROCEDURES  Procedure(s) performed: No  Procedures   Critical Care performed: No ____________________________________________   INITIAL IMPRESSION / ASSESSMENT AND PLAN / ED COURSE  Pertinent labs & imaging results that were available during my care of the patient were reviewed by me and considered in my medical decision making (see chart for details).  Patient presents with right flank pain as described above.  Differential includes pyelonephritis, ureterolithiasis, UTI  Lab work notable for mild elevation of white blood cell count, BMP is normal.  Pending urinalysis  Treated with IV Toradol and IV Zofran with near total resolution of pain  CT abdomen pelvis demonstrates 4 mm distal UVJ stone  Urinalysis Is limited by color interference of urine pigment likely secondary to Azo  Given her symptoms of UTI and kidney stone will treat with IV Rocephin and discuss with urology  Dr. Lonna Cobb will follow up with patient in office, appropriate for discharge with antibiotics, strict return precautions   ____________________________________________   FINAL CLINICAL IMPRESSION(S) / ED DIAGNOSES  Final diagnoses:  Ureterolithiasis        Note:  This document was prepared using Dragon voice recognition software and may include unintentional dictation errors.   Jene Every, MD 09/14/19 2152

## 2019-12-24 ENCOUNTER — Other Ambulatory Visit: Payer: Self-pay | Admitting: Internal Medicine

## 2020-04-14 ENCOUNTER — Other Ambulatory Visit: Payer: Self-pay | Admitting: Internal Medicine

## 2020-06-07 DIAGNOSIS — M2550 Pain in unspecified joint: Secondary | ICD-10-CM | POA: Diagnosis not present

## 2020-06-07 DIAGNOSIS — Z79899 Other long term (current) drug therapy: Secondary | ICD-10-CM | POA: Diagnosis not present

## 2020-06-07 DIAGNOSIS — L400 Psoriasis vulgaris: Secondary | ICD-10-CM | POA: Diagnosis not present

## 2020-07-06 ENCOUNTER — Other Ambulatory Visit: Payer: Self-pay | Admitting: Internal Medicine

## 2020-10-03 DIAGNOSIS — J0100 Acute maxillary sinusitis, unspecified: Secondary | ICD-10-CM | POA: Diagnosis not present

## 2021-01-11 IMAGING — CT CT RENAL STONE PROTOCOL
2 of 4 series · 16 of 46 positions shown, 18 images · non-contrast
Comparison: None.

CLINICAL DATA: 49-year-old female with right flank and pelvic pain.

EXAM:
CT ABDOMEN AND PELVIS WITHOUT CONTRAST
TECHNIQUE: Multidetector CT imaging of the abdomen and pelvis was performed
following the standard protocol without IV contrast.

[Series 2: stone full standard · axial · 0.73mm/px · z∈[-274,+156]mm · 13 of 96 slices shown, 15 images]
[im 5/96  soft-tissue]
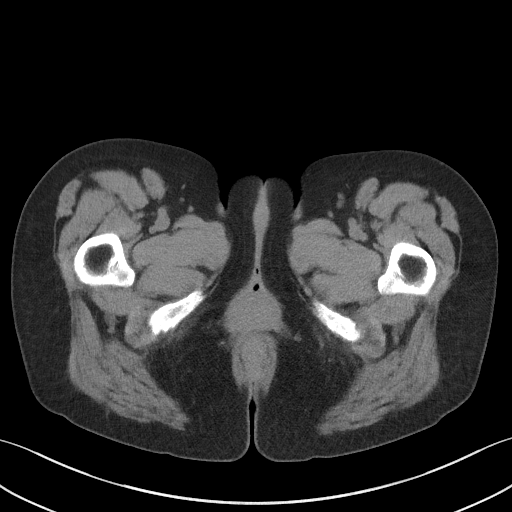
[im 5/96  bone]
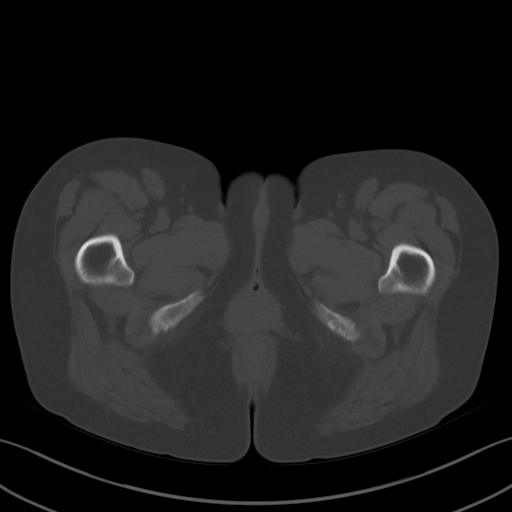
[im 13/96  soft-tissue]
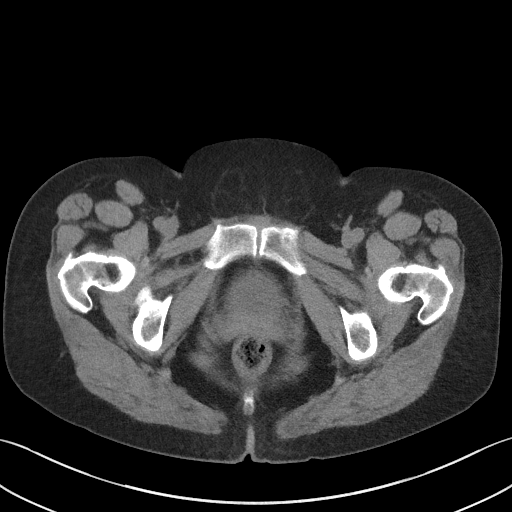
[im 21/96  soft-tissue]
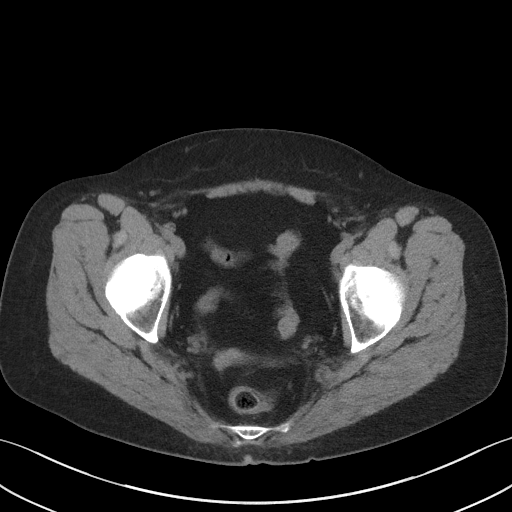
[im 25/96  soft-tissue]
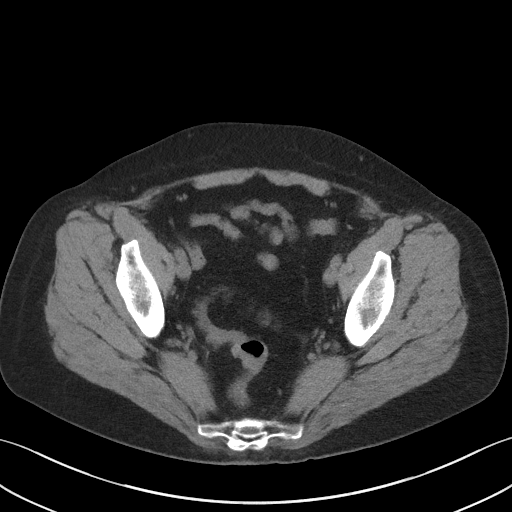
[im 34/96  soft-tissue]
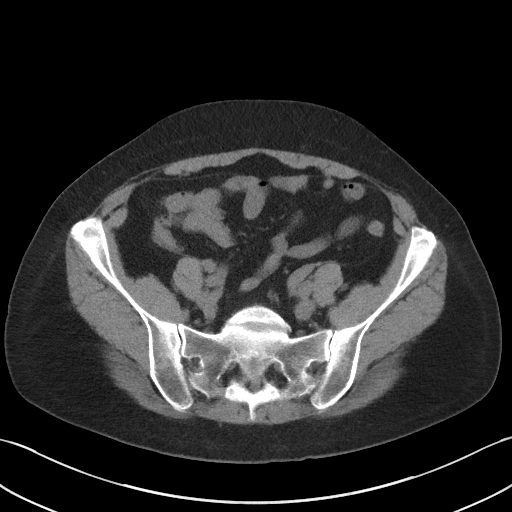
[im 42/96  soft-tissue]
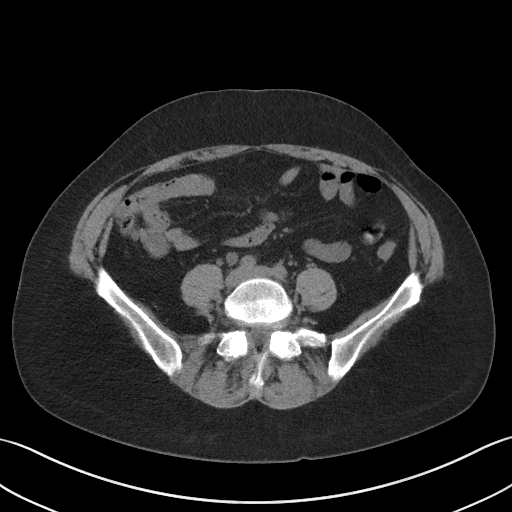
[im 50/96  soft-tissue]
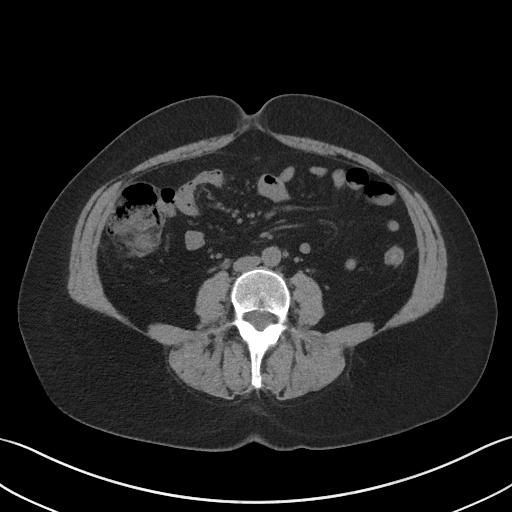
[im 54/96  soft-tissue]
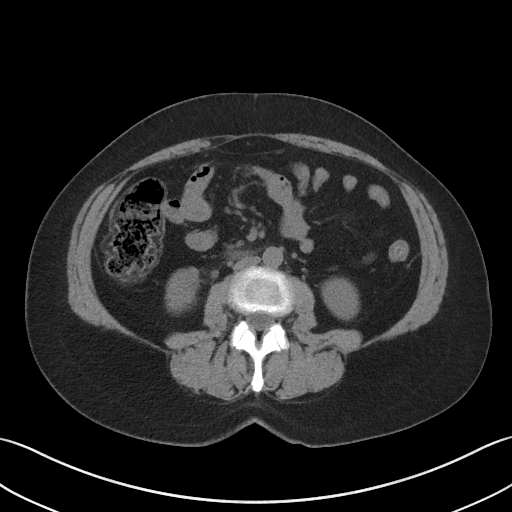
[im 62/96  soft-tissue]
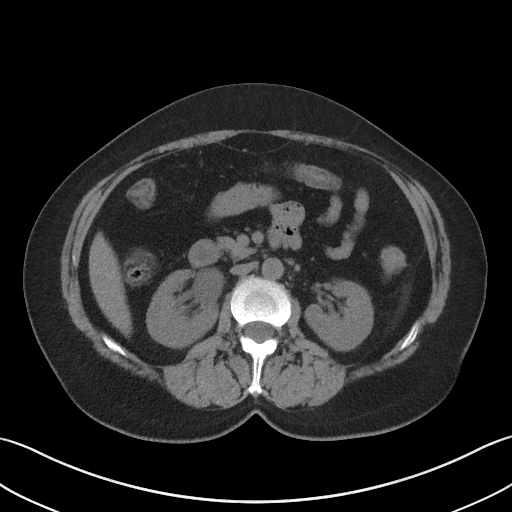
[im 62/96  bone]
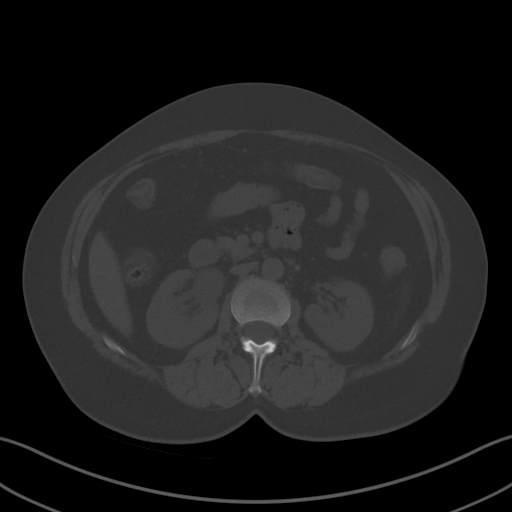
[im 71/96  soft-tissue]
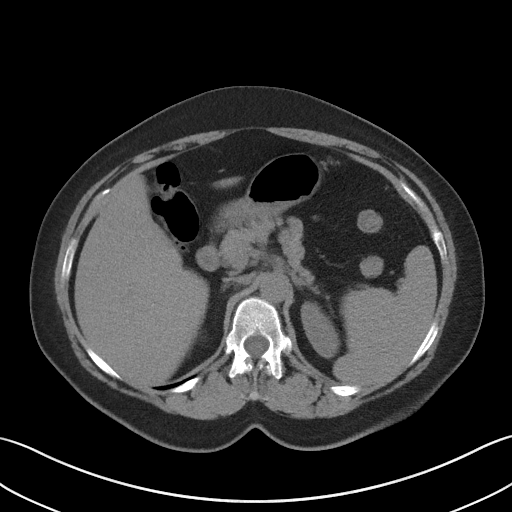
[im 75/96  soft-tissue]
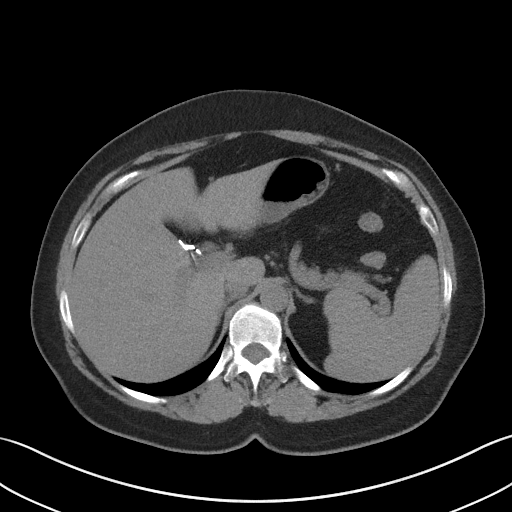
[im 83/96  soft-tissue]
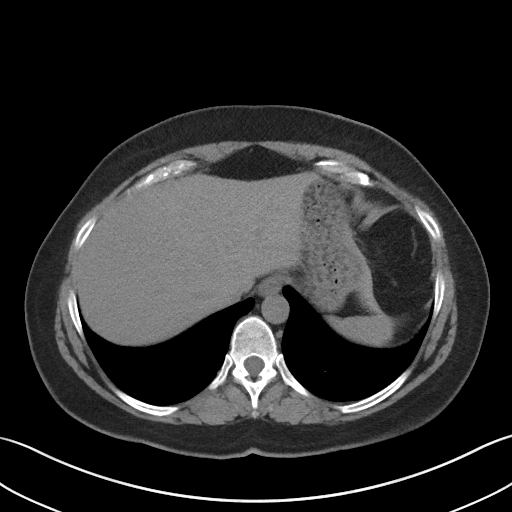
[im 91/96  soft-tissue]
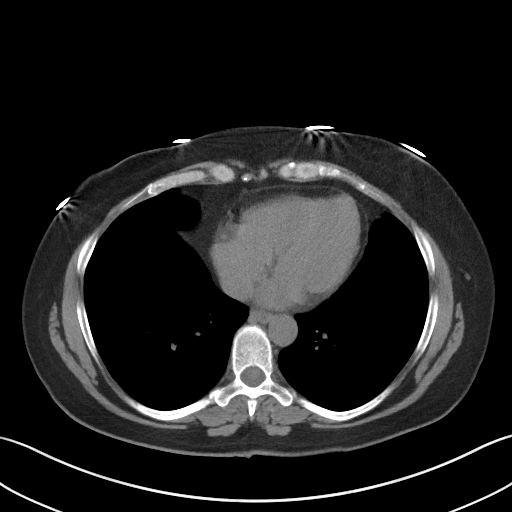

[Series 5: coronal · coronal · 0.74mm/px · 3 of 133 slices shown]
[im 45/133  soft-tissue]
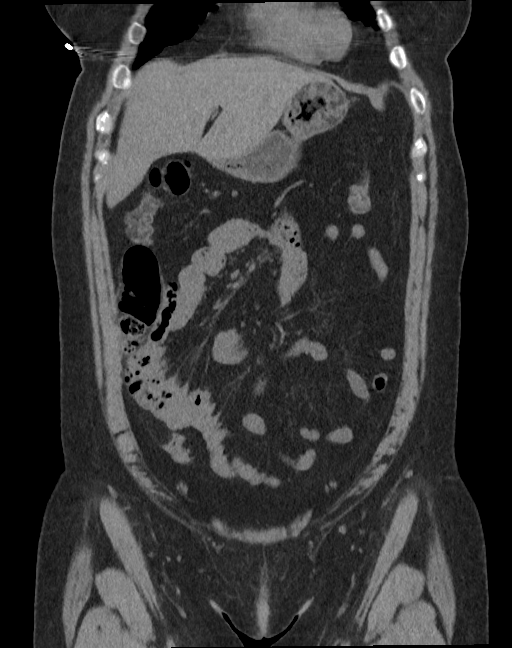
[im 59/133  soft-tissue]
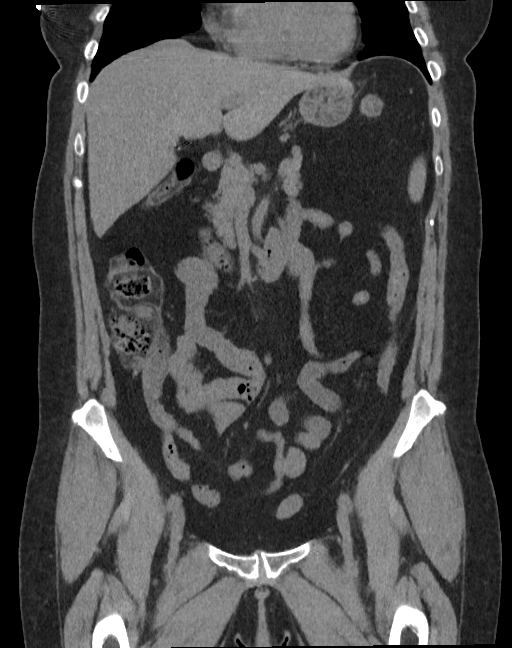
[im 74/133  soft-tissue]
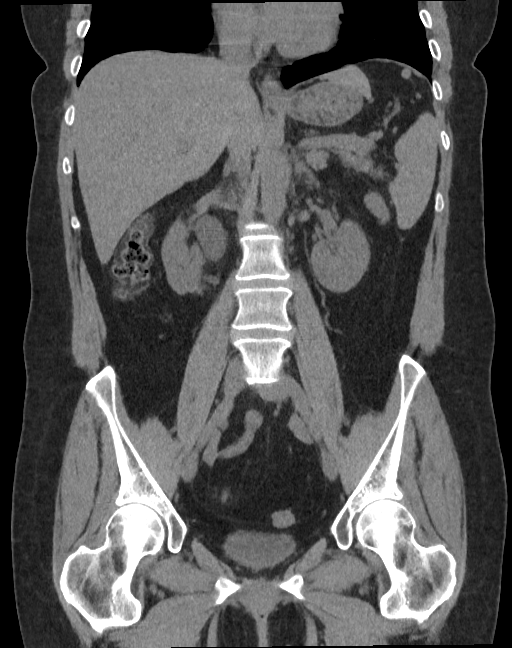

[16 of 46 positions shown; findings below may reference images not displayed]

FINDINGS: Lower chest: Negative.

Hepatobiliary: Absent gallbladder.  Negative noncontrast liver.

Pancreas: Negative.

Spleen: Negative.

Adrenals/Urinary Tract: Normal adrenal glands. Punctate
nephrolithiasis at the left lower pole, otherwise negative
noncontrast left kidney and left ureter.

Moderate right hydronephrosis with mild right hydroureter into the
pelvis where a distal obstructing calculus is located just proximal
to the right ureterovesical junction measuring 4 to 5 mm (series 2,
image 80 and coronal image 94). There is also a small dependent
calculus within the midline bladder (series 2, image 79 and same
coronal image). Furthermore, there is a superimposed right pelvic
phlebolith versus a tandem distal 4-5 mm calculus adjacent just
upstream of the UVJ stone on series 2, image 77 and coronal image
98. Otherwise unremarkable urinary bladder. No right intrarenal
calculus identified.

Stomach/Bowel: Negative. Normal appendix (series 5, image 63). No
dilated bowel. No free air or free fluid.

Vascular/Lymphatic: Normal caliber aorta. Vascular patency is not
evaluated in the absence of IV contrast. No lymphadenopathy.

Reproductive: Surgically absent uterus. Normal ovaries along the
pelvic side wall on series 2, image 64.

Other: No pelvic free fluid.

Musculoskeletal: Negative aside from mild lower lumbar facet
hypertrophy.
IMPRESSION: 1. Acute obstructive uropathy on the right.
There is a 4-5 mm distal right ureteral calculus located at the
right UVJ, and possibly a tandem distal 4-5 mm stone just upstream
of that (versus a pelvic phlebolith). Furthermore, there is also a
small dependent stone within the urinary bladder.

2. No right nephrolithiasis. Punctate left lower pole
nephrolithiasis.

## 2021-02-06 ENCOUNTER — Other Ambulatory Visit: Payer: Self-pay

## 2021-02-06 ENCOUNTER — Encounter: Payer: Self-pay | Admitting: Internal Medicine

## 2021-02-06 ENCOUNTER — Ambulatory Visit (INDEPENDENT_AMBULATORY_CARE_PROVIDER_SITE_OTHER): Payer: BC Managed Care – PPO | Admitting: Internal Medicine

## 2021-02-06 VITALS — BP 112/75 | HR 68 | Temp 97.4°F | Resp 17 | Ht 61.0 in | Wt 153.2 lb

## 2021-02-06 DIAGNOSIS — Z1211 Encounter for screening for malignant neoplasm of colon: Secondary | ICD-10-CM | POA: Diagnosis not present

## 2021-02-06 DIAGNOSIS — Z1231 Encounter for screening mammogram for malignant neoplasm of breast: Secondary | ICD-10-CM | POA: Diagnosis not present

## 2021-02-06 DIAGNOSIS — Z0001 Encounter for general adult medical examination with abnormal findings: Secondary | ICD-10-CM | POA: Diagnosis not present

## 2021-02-06 DIAGNOSIS — K219 Gastro-esophageal reflux disease without esophagitis: Secondary | ICD-10-CM | POA: Diagnosis not present

## 2021-02-06 DIAGNOSIS — E785 Hyperlipidemia, unspecified: Secondary | ICD-10-CM | POA: Insufficient documentation

## 2021-02-06 DIAGNOSIS — Z6828 Body mass index (BMI) 28.0-28.9, adult: Secondary | ICD-10-CM

## 2021-02-06 DIAGNOSIS — Z23 Encounter for immunization: Secondary | ICD-10-CM

## 2021-02-06 DIAGNOSIS — R7309 Other abnormal glucose: Secondary | ICD-10-CM | POA: Diagnosis not present

## 2021-02-06 DIAGNOSIS — Z114 Encounter for screening for human immunodeficiency virus [HIV]: Secondary | ICD-10-CM

## 2021-02-06 DIAGNOSIS — B001 Herpesviral vesicular dermatitis: Secondary | ICD-10-CM | POA: Insufficient documentation

## 2021-02-06 DIAGNOSIS — F339 Major depressive disorder, recurrent, unspecified: Secondary | ICD-10-CM | POA: Diagnosis not present

## 2021-02-06 DIAGNOSIS — L409 Psoriasis, unspecified: Secondary | ICD-10-CM | POA: Diagnosis not present

## 2021-02-06 DIAGNOSIS — Z1159 Encounter for screening for other viral diseases: Secondary | ICD-10-CM

## 2021-02-06 DIAGNOSIS — K582 Mixed irritable bowel syndrome: Secondary | ICD-10-CM | POA: Diagnosis not present

## 2021-02-06 DIAGNOSIS — E663 Overweight: Secondary | ICD-10-CM | POA: Insufficient documentation

## 2021-02-06 DIAGNOSIS — E7800 Pure hypercholesterolemia, unspecified: Secondary | ICD-10-CM

## 2021-02-06 DIAGNOSIS — K589 Irritable bowel syndrome without diarrhea: Secondary | ICD-10-CM | POA: Insufficient documentation

## 2021-02-06 NOTE — Assessment & Plan Note (Signed)
Continue Valacyclovir as needed 

## 2021-02-06 NOTE — Assessment & Plan Note (Signed)
CMET and lipid profile today Encouraged her to consume a low fat diet 

## 2021-02-06 NOTE — Assessment & Plan Note (Signed)
Try to identify triggers and avoid them Will monitor

## 2021-02-06 NOTE — Assessment & Plan Note (Signed)
Stable off meds Support offered 

## 2021-02-06 NOTE — Assessment & Plan Note (Signed)
Clobetasol refilled today 

## 2021-02-06 NOTE — Progress Notes (Signed)
Subjective:    Patient ID: Rebecca Buchanan, female    DOB: Dec 02, 1970, 50 y.o.   MRN: [id number]  HPI  Pt presents to the clinic today for her annual exam. She is also due to follow up chronic conditions.  Depression: Persistent but mild. She failed Buproprion. She is not currently seeing a therapist. She denies anxiety, SI/HI.  GERD: Triggered by spicy, tomoto based foods or eating late at night. She takes Famotidine as needed. There is no upper GI on file.  IBS: Alternating constipation and diarrhea. She does not take any medication OTC for this. She has never had a colonoscopy.  Psoriasis: Managed with Clobetasol prn. She would like a refill of this today.  Cold Sores: She denies recent flare. She takes Valacyclovir as needed with good results.  HLD: Her last LDL was 168, triglycerides 104, 07/2019. She is not taking any cholesterol lowering medication at this time. She does not consume a low fat diet.  Flu: never Tetanus:  02/2021 Covid: never Shingrix: never Pap Smear: partial hysterectomy Mammogram: 2011 Colon Screening: never Vision Screening: as needed Dentist: as needed  Diet: Sheg does eat meat. She consuems fruits and veggies. She does eat some fried foods. She drinks mostly sweet tea.  Exercise: Walking  Review of Systems     Past Medical History:  Diagnosis Date   Allergy     Current Outpatient Medications  Medication Sig Dispense Refill   buPROPion (WELLBUTRIN XL) 150 MG 24 hr tablet TAKE 1 TABLET BY MOUTH DAILY 90 tablet 1   cephALEXin (KEFLEX) 500 MG capsule Take 1 capsule (500 mg total) by mouth 2 (two) times daily. 14 capsule 0   cetirizine (ZYRTEC) 10 MG tablet Take 10 mg by mouth daily.     clobetasol (TEMOVATE) 0.05 % external solution APPLY EVERY DAY 240 mL 1   clobetasol cream (TEMOVATE) 0.05 % APPLY TOPICALLY TWICE DAILY 30 g 0   famotidine (PEPCID) 20 MG tablet Take 20 mg by mouth as needed.      HYDROcodone-acetaminophen (NORCO/VICODIN)  5-325 MG tablet Take 1 tablet by mouth every 6 (six) hours as needed for moderate pain. 20 tablet 0   ibuprofen (ADVIL) 200 MG tablet Take 200 mg by mouth daily as needed.     naproxen (NAPROSYN) 500 MG tablet Take 1 tablet (500 mg total) by mouth 2 (two) times daily with a meal. 20 tablet 2   naproxen sodium (ALEVE) 220 MG tablet Take 220 mg by mouth daily as needed.     ondansetron (ZOFRAN ODT) 4 MG disintegrating tablet Take 1 tablet (4 mg total) by mouth every 8 (eight) hours as needed. 20 tablet 0   tamsulosin (FLOMAX) 0.4 MG CAPS capsule Take 1 capsule (0.4 mg total) by mouth daily. 7 capsule 0   valACYclovir (VALTREX) 500 MG tablet TAKE 1 TABLET BY MOUTH TWICE DAILY FOR 3DAYS DURING OUTBREAK. 30 tablet 1   No current facility-administered medications for this visit.    No Known Allergies  Family History  Problem Relation Age of Onset   Depression Mother    Alzheimer's disease Mother    Lung cancer Father    Colon polyps Sister    Depression Sister    Lung cancer Maternal Grandfather    Depression Sister     Social History   Socioeconomic History   Marital status: Married    Spouse name: Not on file   Number of children: Not on file   Years of education: Not  on file   Highest education level: Not on file  Occupational History   Not on file  Tobacco Use   Smoking status: Never   Smokeless tobacco: Never  Vaping Use   Vaping Use: Never used  Substance and Sexual Activity   Alcohol use: Yes   Drug use: Never   Sexual activity: Yes    Birth control/protection: Surgical    Comment: Partial Hysterectomy  Other Topics Concern   Not on file  Social History Narrative   Not on file   Social Determinants of Health   Financial Resource Strain: Not on file  Food Insecurity: Not on file  Transportation Needs: Not on file  Physical Activity: Not on file  Stress: Not on file  Social Connections: Not on file  Intimate Partner Violence: Not on file      Constitutional: Denies fever, malaise, fatigue, headache or abrupt weight changes.  HEENT: Denies eye pain, eye redness, ear pain, ringing in the ears, wax buildup, runny nose, nasal congestion, bloody nose, or sore throat. Respiratory: Denies difficulty breathing, shortness of breath, cough or sputum production.   Cardiovascular: Denies chest pain, chest tightness, palpitations or swelling in the hands or feet.  Gastrointestinal: Pt reports alternating constipation and diarrhea. Denies abdominal pain, bloating, or blood in the stool.  GU: Denies urgency, frequency, pain with urination, burning sensation, blood in urine, odor or discharge. Musculoskeletal: Denies decrease in range of motion, difficulty with gait, muscle pain or joint pain and swelling.  Skin: Pt reports psoriasis. Denies redness, lesions or ulcercations.  Neurological: Denies dizziness, difficulty with memory, difficulty with speech or problems with balance and coordination.  Psych: Pt has a history of depression. Denies anxiety, SI/HI.  No other specific complaints in a complete review of systems (except as listed in HPI above).  Objective:   Physical Exam BP 112/75 (BP Location: Right Arm, Patient Position: Sitting, Cuff Size: Normal)   Pulse 68   Temp (!) 97.4 F (36.3 C) (Temporal)   Resp 17   Ht 5\' 1"  (1.549 m)   Wt 153 lb 3.2 oz (69.5 kg)   SpO2 100%   BMI 28.95 kg/m   Wt Readings from Last 3 Encounters:  09/14/19 156 lb (70.8 kg)  07/22/19 156 lb (70.8 kg)  02/17/19 157 lb (71.2 kg)    General: Appears her stated age, overweight, in NAD. Skin: Warm, dry and intact.  HEENT: Head: normal shape and size; Eyes: sclera white and EOMs intact;  Neck:  Neck supple, trachea midline. No masses, lumps or thyromegaly present.  Cardiovascular: Normal rate and rhythm. S1,S2 noted.  No murmur, rubs or gallops noted. No JVD or BLE edema. No carotid bruits noted. Pulmonary/Chest: Normal effort and positive  vesicular breath sounds. No respiratory distress. No wheezes, rales or ronchi noted.  Abdomen: Soft and nontender. Normal bowel sounds. No distention or masses noted. Liver, spleen and kidneys non palpable. Musculoskeletal: Strength 5/5 BUE/BLE. No difficulty with gait.  Neurological: Alert and oriented. Cranial nerves II-XII grossly intact. Coordination normal.  Psychiatric: Mood and affect normal. Behavior is normal. Judgment and thought content normal.     BMET    Component Value Date/Time   NA 140 09/14/2019 1904   K 3.6 09/14/2019 1904   CL 105 09/14/2019 1904   CO2 24 09/14/2019 1904   GLUCOSE 128 (H) 09/14/2019 1904   BUN 13 09/14/2019 1904   CREATININE 0.95 09/14/2019 1904   CALCIUM 9.2 09/14/2019 1904   GFRNONAA >60 09/14/2019 1904  GFRAA >60 09/14/2019 1904    Lipid Panel     Component Value Date/Time   CHOL 264 (H) 07/22/2019 1540   TRIG 104.0 07/22/2019 1540   HDL 75.50 07/22/2019 1540   CHOLHDL 4 07/22/2019 1540   VLDL 20.8 07/22/2019 1540   LDLCALC 168 (H) 07/22/2019 1540    CBC    Component Value Date/Time   WBC 11.2 (H) 09/14/2019 1904   RBC 4.29 09/14/2019 1904   HGB 12.9 09/14/2019 1904   HGB 13.8 03/14/2018 1412   HCT 36.9 09/14/2019 1904   HCT 39.1 03/14/2018 1412   PLT 216 09/14/2019 1904   PLT 228 03/14/2018 1412   MCV 86.0 09/14/2019 1904   MCV 85 03/14/2018 1412   MCH 30.1 09/14/2019 1904   MCHC 35.0 09/14/2019 1904   RDW 13.0 09/14/2019 1904   RDW 12.5 03/14/2018 1412    Hgb A1C No results found for: HGBA1C          Assessment & Plan:   Preventative Health Maintenance:  She declines flu shot today Tetanus UTD Encouraged her to get her covid vaccine She will check her insurance coverage re: Shingrix and schedule a nurse visit here if she would like to get this done She no longer needs pap smears Mammogram ordered- she will call to schedule Referral to GI for screening colonoscopy Encouraged her to consume a balanced  diet and exercise regimen Advised her to see an eye doctor and dentist annually Will check CBC, CMET, Lipid, A1C, HIV and Hep C today  RTC in 1 year, sooner if needed Nicki Reaper, NP This visit occurred during the SARS-CoV-2 public health emergency.  Safety protocols were in place, including screening questions prior to the visit, additional usage of staff PPE, and extensive cleaning of exam room while observing appropriate contact time as indicated for disinfecting solutions.

## 2021-02-06 NOTE — Assessment & Plan Note (Signed)
Encouraged diet and exercise for weight loss ?

## 2021-02-06 NOTE — Assessment & Plan Note (Signed)
Try to avoid foods that trigger your reflux Encouraged weight loss as this can help reduce reflux symptoms Continue Famotidine as needed

## 2021-02-07 LAB — LIPID PANEL
Cholesterol: 276 mg/dL — ABNORMAL HIGH (ref <200)
HDL: 90 mg/dL (ref >=50)
LDL Cholesterol (Calc): 164 mg/dL (calc) — ABNORMAL HIGH
Non-HDL Cholesterol (Calc): 186 mg/dL (calc) — ABNORMAL HIGH (ref <130)
Total CHOL/HDL Ratio: 3.1 (calc) (ref <5.0)
Triglycerides: 107 mg/dL (ref <150)

## 2021-02-07 LAB — COMPLETE METABOLIC PANEL WITH GFR
AG Ratio: 1.9 (calc) (ref 1.0–2.5)
ALT: 22 U/L (ref 6–29)
AST: 24 U/L (ref 10–35)
Albumin: 4.5 g/dL (ref 3.6–5.1)
Alkaline phosphatase (APISO): 92 U/L (ref 37–153)
BUN/Creatinine Ratio: 8 (calc) (ref 6–22)
BUN: 12 mg/dL (ref 7–25)
CO2: 30 mmol/L (ref 20–32)
Calcium: 9.8 mg/dL (ref 8.6–10.4)
Chloride: 103 mmol/L (ref 98–110)
Creat: 1.5 mg/dL — ABNORMAL HIGH (ref 0.50–1.03)
Globulin: 2.4 g/dL (calc) (ref 1.9–3.7)
Glucose, Bld: 94 mg/dL (ref 65–99)
Potassium: 4 mmol/L (ref 3.5–5.3)
Sodium: 141 mmol/L (ref 135–146)
Total Bilirubin: 0.6 mg/dL (ref 0.2–1.2)
Total Protein: 6.9 g/dL (ref 6.1–8.1)
eGFR: 42 mL/min/{1.73_m2} — ABNORMAL LOW (ref >=60)

## 2021-02-07 LAB — CBC
HCT: 38.8 % (ref 35.0–45.0)
Hemoglobin: 13.2 g/dL (ref 11.7–15.5)
MCH: 30.1 pg (ref 27.0–33.0)
MCHC: 34 g/dL (ref 32.0–36.0)
MCV: 88.6 fL (ref 80.0–100.0)
MPV: 11 fL (ref 7.5–12.5)
Platelets: 204 10*3/uL (ref 140–400)
RBC: 4.38 10*6/uL (ref 3.80–5.10)
RDW: 13 % (ref 11.0–15.0)
WBC: 6.2 10*3/uL (ref 3.8–10.8)

## 2021-02-07 LAB — HEMOGLOBIN A1C
Hgb A1c MFr Bld: 5.5 % of total Hgb (ref <5.7)
Mean Plasma Glucose: 111 mg/dL
eAG (mmol/L): 6.2 mmol/L

## 2021-02-07 LAB — HIV ANTIBODY (ROUTINE TESTING W REFLEX): HIV 1&2 Ab, 4th Generation: NONREACTIVE

## 2021-02-07 LAB — HEPATITIS C ANTIBODY
Hepatitis C Ab: NONREACTIVE
SIGNAL TO CUT-OFF: <0.02 (ref <1.00)

## 2021-02-08 ENCOUNTER — Encounter: Payer: Self-pay | Admitting: Internal Medicine

## 2021-02-16 ENCOUNTER — Telehealth: Payer: Self-pay

## 2021-02-16 ENCOUNTER — Other Ambulatory Visit: Payer: Self-pay

## 2021-02-16 DIAGNOSIS — Z1211 Encounter for screening for malignant neoplasm of colon: Secondary | ICD-10-CM

## 2021-02-16 MED ORDER — NA SULFATE-K SULFATE-MG SULF 17.5-3.13-1.6 GM/177ML PO SOLN: 1.0000 | Freq: Once | ORAL | 0 refills | Status: AC

## 2021-02-16 NOTE — Progress Notes (Signed)
Gastroenterology Pre-Procedure Review  Request Date: 03/22/2021 Requesting Physician: Dr. Allegra Lai   PATIENT REVIEW QUESTIONS: The patient responded to the following health history questions as indicated:    1. Are you having any GI issues? no 2. Do you have a personal history of Polyps? no 3. Do you have a family history of Colon Cancer or Polyps? yes (SISTER POLYPS) 4. Diabetes Mellitus? no 5. Joint replacements in the past 12 months?no 6. Major health problems in the past 3 months?no 7. Any artificial heart valves, MVP, or defibrillator?no    MEDICATIONS & ALLERGIES:    Patient reports the following regarding taking any anticoagulation/antiplatelet therapy:   Plavix, Coumadin, Eliquis, Xarelto, Lovenox, Pradaxa, Brilinta, or Effient? no Aspirin? no  Patient confirms/reports the following medications:  Current Outpatient Medications  Medication Sig Dispense Refill   cetirizine (ZYRTEC) 10 MG tablet Take 10 mg by mouth daily as needed.     clobetasol (TEMOVATE) 0.05 % external solution APPLY EVERY DAY 240 mL 1   clobetasol cream (TEMOVATE) 0.05 % APPLY TOPICALLY TWICE DAILY 30 g 0   famotidine (PEPCID) 20 MG tablet Take 20 mg by mouth as needed.      ibuprofen (ADVIL) 200 MG tablet Take 200 mg by mouth daily as needed.     naproxen sodium (ALEVE) 220 MG tablet Take 220 mg by mouth daily as needed.     valACYclovir (VALTREX) 500 MG tablet TAKE 1 TABLET BY MOUTH TWICE DAILY FOR 3DAYS DURING OUTBREAK. 30 tablet 1   No current facility-administered medications for this visit.    Patient confirms/reports the following allergies:  No Known Allergies  No orders of the defined types were placed in this encounter.   AUTHORIZATION INFORMATION Primary Insurance: 1D#: Group #:  Secondary Insurance: 1D#: Group #:  SCHEDULE INFORMATION: Date:  03/22/2021 Time: Location: [facility]

## 2021-02-16 NOTE — Telephone Encounter (Signed)
ANSWERED SCHEDULED

## 2021-03-20 ENCOUNTER — Telehealth: Payer: Self-pay

## 2021-03-20 NOTE — Telephone Encounter (Signed)
Patient called Rebecca Buchanan in endo wanting to reschedule her colonoscopy that is schedule for 03/22/21.Called and left a message for call back

## 2021-03-21 NOTE — Telephone Encounter (Signed)
Patient states she needs a Monday or Friday to have a ride move patient to 04/17/21 with Dr. Allegra Lai informed Rosann Auerbach of the change

## 2021-03-24 ENCOUNTER — Telehealth: Payer: Self-pay

## 2021-03-24 NOTE — Telephone Encounter (Signed)
Patient left a voicemail about making a appointment. Tried to call patient but mailbox is full

## 2021-04-17 ENCOUNTER — Ambulatory Visit: Payer: BC Managed Care – PPO | Admitting: Anesthesiology

## 2021-04-17 ENCOUNTER — Ambulatory Visit
Admission: RE | Admit: 2021-04-17 | Discharge: 2021-04-17 | Disposition: A | Discharge status: Home / Self Care | Payer: BC Managed Care – PPO | Attending: Gastroenterology | Admitting: Gastroenterology

## 2021-04-17 ENCOUNTER — Encounter
Admission: RE | Disposition: A | Discharge status: Home / Self Care | Payer: Self-pay | Source: Home / Self Care | Attending: Gastroenterology

## 2021-04-17 ENCOUNTER — Encounter: Payer: Self-pay | Admitting: Gastroenterology

## 2021-04-17 DIAGNOSIS — K219 Gastro-esophageal reflux disease without esophagitis: Secondary | ICD-10-CM | POA: Insufficient documentation

## 2021-04-17 DIAGNOSIS — L918 Other hypertrophic disorders of the skin: Secondary | ICD-10-CM | POA: Diagnosis not present

## 2021-04-17 DIAGNOSIS — Z1211 Encounter for screening for malignant neoplasm of colon: Secondary | ICD-10-CM | POA: Diagnosis not present

## 2021-04-17 DIAGNOSIS — K644 Residual hemorrhoidal skin tags: Secondary | ICD-10-CM | POA: Insufficient documentation

## 2021-04-17 HISTORY — PX: COLONOSCOPY WITH PROPOFOL: SHX5780

## 2021-04-17 SURGERY — COLONOSCOPY WITH PROPOFOL
Anesthesia: General

## 2021-04-17 MED ORDER — PROPOFOL 10 MG/ML IV BOLUS
INTRAVENOUS | Status: DC | PRN
Start: 2021-04-17 — End: 2021-04-17
  Administered 2021-04-17: 70 mg via INTRAVENOUS

## 2021-04-17 MED ORDER — PROPOFOL 500 MG/50ML IV EMUL
INTRAVENOUS | Status: DC | PRN
Start: 2021-04-17 — End: 2021-04-17
  Administered 2021-04-17: 140 ug/kg/min via INTRAVENOUS

## 2021-04-17 MED ORDER — SODIUM CHLORIDE 0.9 % IV SOLN
INTRAVENOUS | Status: DC
  Administered 2021-04-17: 1000 mL via INTRAVENOUS

## 2021-04-17 NOTE — Anesthesia Preprocedure Evaluation (Signed)
Anesthesia Evaluation  Patient identified by MRN, date of birth, ID band Patient awake    Reviewed: Allergy & Precautions, NPO status , Patient's Chart, lab work & pertinent test results  History of Anesthesia Complications Negative for: history of anesthetic complications  Airway Mallampati: III  TM Distance: >3 FB Neck ROM: Full    Dental no notable dental hx. (+) Teeth Intact   Pulmonary neg pulmonary ROS, neg sleep apnea, neg COPD, Patient abstained from smoking.Not current smoker,    Pulmonary exam normal breath sounds clear to auscultation       Cardiovascular Exercise Tolerance: Good METS(-) hypertension(-) CAD and (-) Past MI negative cardio ROS  (-) dysrhythmias  Rhythm:Regular Rate:Normal - Systolic murmurs    Neuro/Psych PSYCHIATRIC DISORDERS Depression negative neurological ROS     GI/Hepatic GERD  ,(+)     (-) substance abuse  ,   Endo/Other  neg diabetes  Renal/GU negative Renal ROS     Musculoskeletal   Abdominal   Peds  Hematology   Anesthesia Other Findings Past Medical History: No date: Allergy  Reproductive/Obstetrics                             Anesthesia Physical Anesthesia Plan  ASA: 2  Anesthesia Plan: General   Post-op Pain Management: Minimal or no pain anticipated   Induction: Intravenous  PONV Risk Score and Plan: 3 and Propofol infusion, TIVA and Ondansetron  Airway Management Planned: Nasal Cannula  Additional Equipment: None  Intra-op Plan:   Post-operative Plan:   Informed Consent: I have reviewed the patients History and Physical, chart, labs and discussed the procedure including the risks, benefits and alternatives for the proposed anesthesia with the patient or authorized representative who has indicated his/her understanding and acceptance.     Dental advisory given  Plan Discussed with: CRNA and Surgeon  Anesthesia Plan  Comments: (Discussed risks of anesthesia with patient, including possibility of difficulty with spontaneous ventilation under anesthesia necessitating airway intervention, PONV, and rare risks such as cardiac or respiratory or neurological events, and allergic reactions. Discussed the role of CRNA in patient's perioperative care. Patient understands.)        Anesthesia Quick Evaluation

## 2021-04-17 NOTE — Anesthesia Postprocedure Evaluation (Signed)
Anesthesia Post Note  Patient: Rebecca Buchanan  Procedure(s) Performed: COLONOSCOPY WITH PROPOFOL  Patient location during evaluation: Endoscopy Anesthesia Type: General Level of consciousness: awake and alert Pain management: pain level controlled Vital Signs Assessment: post-procedure vital signs reviewed and stable Respiratory status: spontaneous breathing, nonlabored ventilation, respiratory function stable and patient connected to nasal cannula oxygen Cardiovascular status: blood pressure returned to baseline and stable Postop Assessment: no apparent nausea or vomiting Anesthetic complications: no   No notable events documented.   Last Vitals:  Vitals:   04/17/21 0755 04/17/21 0905  BP: 125/82 103/72  Pulse: 72 77  Resp: 16 13  Temp: (!) 35.7 C 36.4 C  SpO2: 100% 96%    Last Pain:  Vitals:   04/17/21 0905  TempSrc: Temporal  PainSc: Asleep                 Arita Miss

## 2021-04-17 NOTE — H&P (Signed)
Arlyss Repress, MD [address]  [address]  [place], [place] [address]  Main: [phone]  Fax: [phone] Pager: [phone]  Primary Care Physician:  Lorre Munroe, NP Primary Gastroenterologist:  Dr. Arlyss Repress  Pre-Procedure History & Physical: HPI:  Rebecca Buchanan is a 51 y.o. female is here for an colonoscopy.   Past Medical History:  Diagnosis Date   Allergy     Past Surgical History:  Procedure Laterality Date   ABDOMINAL HYSTERECTOMY     partial   BLADDER SURGERY     bladder sling   CHOLECYSTECTOMY     OTHER SURGICAL HISTORY     Hysterectomy, just uterus removed;heavy bleeding    Prior to Admission medications   Medication Sig Start Date End Date Taking? Authorizing Provider  cetirizine (ZYRTEC) 10 MG tablet Take 10 mg by mouth daily as needed.   Yes [provider]  clobetasol (TEMOVATE) 0.05 % external solution APPLY EVERY DAY 04/15/20  Yes Baity, Salvadore Oxford, NP  clobetasol cream (TEMOVATE) 0.05 % APPLY TOPICALLY TWICE DAILY 12/25/19  Yes Baity, Salvadore Oxford, NP  famotidine (PEPCID) 20 MG tablet Take 20 mg by mouth as needed.     [provider]  ibuprofen (ADVIL) 200 MG tablet Take 200 mg by mouth daily as needed.    [provider]  naproxen sodium (ALEVE) 220 MG tablet Take 220 mg by mouth daily as needed.    [provider]  valACYclovir (VALTREX) 500 MG tablet TAKE 1 TABLET BY MOUTH TWICE DAILY FOR 3DAYS DURING OUTBREAK. 12/25/19   Lorre Munroe, NP    Allergies as of 02/16/2021   (No Known Allergies)    Family History  Problem Relation Age of Onset   Depression Mother    Alzheimer's disease Mother    Lung cancer Father    Colon polyps Sister    Depression Sister    Lung cancer Maternal Grandfather    Depression Sister     Social History   Socioeconomic History   Marital status: Married    Spouse name: Not on file   Number of children: Not on file   Years of education: Not on file   Highest  education level: Not on file  Occupational History   Not on file  Tobacco Use   Smoking status: Never   Smokeless tobacco: Never  Vaping Use   Vaping Use: Never used  Substance and Sexual Activity   Alcohol use: Yes   Drug use: Never   Sexual activity: Yes    Birth control/protection: Surgical    Comment: Partial Hysterectomy  Other Topics Concern   Not on file  Social History Narrative   Not on file   Social Determinants of Health   Financial Resource Strain: Not on file  Food Insecurity: Not on file  Transportation Needs: Not on file  Physical Activity: Not on file  Stress: Not on file  Social Connections: Not on file  Intimate Partner Violence: Not on file    Review of Systems: See HPI, otherwise negative ROS  Physical Exam: BP 125/82    Pulse 72    Temp (!) 96.2 F (35.7 C) (Temporal)    Resp 16    Ht 5\' 2"  (1.575 m)    Wt 71.4 kg    SpO2 100%    BMI 28.77 kg/m  General:   Alert,  pleasant and cooperative in NAD Head:  Normocephalic and atraumatic. Neck:  Supple; no masses or thyromegaly.  Lungs:  Clear throughout to auscultation.    Heart:  Regular rate and rhythm. Abdomen:  Soft, nontender and nondistended. Normal bowel sounds, without guarding, and without rebound.   Neurologic:  Alert and  oriented x4;  grossly normal neurologically.  Impression/Plan: Rebecca Buchanan is here for an colonoscopy to be performed for colon cancer screening  Risks, benefits, limitations, and alternatives regarding  colonoscopy have been reviewed with the patient.  Questions have been answered.  All parties agreeable.   Lannette Donath, MD  04/17/2021, 8:34 AM

## 2021-04-17 NOTE — Transfer of Care (Signed)
Immediate Anesthesia Transfer of Care Note  Patient: Rebecca Buchanan  Procedure(s) Performed: COLONOSCOPY WITH PROPOFOL  Patient Location: PACU  Anesthesia Type:General  Level of Consciousness: awake, alert  and oriented  Airway & Oxygen Therapy: Patient Spontanous Breathing  Post-op Assessment: Report given to RN and Post -op Vital signs reviewed and stable  Post vital signs: Reviewed and stable  Last Vitals:  Vitals Value Taken Time  BP 103/72 04/17/21 0905  Temp 36.4 C 04/17/21 0905  Pulse 78 04/17/21 0906  Resp 16 04/17/21 0906  SpO2 98 % 04/17/21 0906  Vitals shown include unvalidated device data.  Last Pain:  Vitals:   04/17/21 0905  TempSrc: Temporal  PainSc: Asleep         Complications: No notable events documented.

## 2021-04-17 NOTE — Op Note (Signed)
[facility] Patient Name: Rebecca Buchanan Procedure Date: 04/17/2021 8:33 AM MRN: [id number] Account #: [id number] Date of Birth: Jul 11, 1970 Admit Type: Outpatient Age: 51 Room: [facility] ENDO ROOM 1 Gender: Female Note Status: Finalized Instrument Name: Prentice Docker [id number] Procedure:             Colonoscopy Indications:           Screening for colorectal malignant neoplasm, This is                         the patient's first colonoscopy Providers:             Toney Reil MD, MD Referring MD:          Lorre Munroe (Referring MD) Medicines:             General Anesthesia Complications:         No immediate complications. Estimated blood loss: None. Procedure:             Pre-Anesthesia Assessment:                        - Prior to the procedure, a History and Physical was                         performed, and patient medications and allergies were                         reviewed. The patient is competent. The risks and                         benefits of the procedure and the sedation options and                         risks were discussed with the patient. All questions                         were answered and informed consent was obtained.                         Patient identification and proposed procedure were                         verified by the physician, the nurse, the                         anesthesiologist, the anesthetist and the technician                         in the pre-procedure area in the procedure room in the                         endoscopy suite. Mental Status Examination: alert and                         oriented. Airway Examination: normal oropharyngeal                         airway and neck mobility. Respiratory Examination:  clear to auscultation. CV Examination: normal.                         Prophylactic Antibiotics: The patient does not require                         prophylactic  antibiotics. Prior Anticoagulants: The                         patient has taken no previous anticoagulant or                         antiplatelet agents. ASA Grade Assessment: II - A                         patient with mild systemic disease. After reviewing                         the risks and benefits, the patient was deemed in                         satisfactory condition to undergo the procedure. The                         anesthesia plan was to use general anesthesia.                         Immediately prior to administration of medications,                         the patient was re-assessed for adequacy to receive                         sedatives. The heart rate, respiratory rate, oxygen                         saturations, blood pressure, adequacy of pulmonary                         ventilation, and response to care were monitored                         throughout the procedure. The physical status of the                         patient was re-assessed after the procedure.                        After obtaining informed consent, the colonoscope was                         passed under direct vision. Throughout the procedure,                         the patient's blood pressure, pulse, and oxygen                         saturations were monitored continuously. The  Colonoscope was introduced through the anus and                         advanced to the the cecum, identified by appendiceal                         orifice and ileocecal valve. The colonoscopy was                         performed without difficulty. The patient tolerated                         the procedure well. The quality of the bowel                         preparation was evaluated using the BBPS [facility] Bowel                         Preparation Scale) with scores of: Right Colon = 3,                         Transverse Colon = 3 and Left Colon = 3 (entire mucosa                          seen well with no residual staining, small fragments                         of stool or opaque liquid). The total BBPS score                         equals 9. Findings:      The perianal and digital rectal examinations were normal. Pertinent       negatives include normal sphincter tone and no palpable rectal lesions.      The entire examined colon appeared normal.      Skin tags were found on perianal exam.      The retroflexed view of the distal rectum and anal verge was normal and       showed no anal or rectal abnormalities. Impression:            - The entire examined colon is normal.                        - Perianal skin tags found on perianal exam.                        - The distal rectum and anal verge are normal on                         retroflexion view.                        - No specimens collected. Recommendation:        - Discharge patient to home (with escort).                        - Resume previous diet today.                        -  Continue present medications.                        - Repeat colonoscopy in 10 years for screening                         purposes. Procedure Code(s):     --- Professional ---                        I5027, Colorectal cancer screening; colonoscopy on                         individual not meeting criteria for high risk Diagnosis Code(s):     --- Professional ---                        Z12.11, Encounter for screening for malignant neoplasm                         of colon                        K64.4, Residual hemorrhoidal skin tags CPT copyright 2019 American Medical Association. All rights reserved. The codes documented in this report are preliminary and upon coder review may  be revised to meet current compliance requirements. Dr. Libby Maw Toney Reil MD, MD 04/17/2021 9:03:16 AM This report has been signed electronically. Number of Addenda: 0 Note Initiated On: 04/17/2021 8:33 AM Scope Withdrawal Time: 0 hours 8  minutes 23 seconds  Total Procedure Duration: 0 hours 14 minutes 55 seconds  Estimated Blood Loss:  Estimated blood loss: none.      [facility]

## 2021-04-18 ENCOUNTER — Encounter: Payer: Self-pay | Admitting: Gastroenterology

## 2022-03-16 ENCOUNTER — Encounter: Payer: Self-pay | Admitting: Internal Medicine

## 2022-03-16 ENCOUNTER — Ambulatory Visit: Payer: BC Managed Care – PPO | Admitting: Internal Medicine

## 2022-03-16 VITALS — BP 126/84 | HR 82 | Temp 96.9°F | Wt 156.0 lb

## 2022-03-16 DIAGNOSIS — Z1231 Encounter for screening mammogram for malignant neoplasm of breast: Secondary | ICD-10-CM | POA: Diagnosis not present

## 2022-03-16 DIAGNOSIS — N3000 Acute cystitis without hematuria: Secondary | ICD-10-CM

## 2022-03-16 LAB — POCT URINALYSIS DIPSTICK
Glucose, UA: NEGATIVE
Ketones, UA: NEGATIVE
Nitrite, UA: POSITIVE
Protein, UA: POSITIVE — AB
Spec Grav, UA: 1.015 (ref 1.010–1.025)
Urobilinogen, UA: 0.2 E.U./dL
pH, UA: 6.5 (ref 5.0–8.0)

## 2022-03-16 MED ORDER — SULFAMETHOXAZOLE-TRIMETHOPRIM 400-80 MG PO TABS: 1.0000 | ORAL_TABLET | Freq: Two times a day (BID) | ORAL | 0 refills | Status: DC

## 2022-03-16 NOTE — Patient Instructions (Signed)
Pregnancy and Urinary Tract Infection ? ?A urinary tract infection (UTI) is an infection of any part of the urinary tract. This includes the kidneys, the tubes that connect the kidneys to the bladder (ureters), the bladder, and the tube that carries urine out of the body (urethra). These organs make, store, and get rid of urine in the body. Your health care provider may use other names to describe the infection. An upper UTI affects the ureters and kidneys (pyelonephritis). A lower UTI affects the bladder (cystitis) and urethra (urethritis). ?Most UTIs are caused by bacteria in the genital area, around the entrance to the urinary tract. These bacteria grow and cause irritation and inflammation of the urinary tract. You are more likely to develop a UTI during pregnancy because: ?The physical and hormonal changes that your body goes through make it easier for bacteria to get into your urinary tract. ?Your growing baby puts pressure on your bladder and can affect urine flow. ?Pregnant women with diabetes are at an increased risk for developing a UTI. It is important to recognize and treat UTIs in pregnancy because they can cause serious complications for both you and your baby. ?How does this affect me? ?Symptoms of a UTI include: ?Needing to urinate right away (urgently) and often, even if urinating a small amount. ?Pain, burning, or having a hard time passing urine. ?Blood in the urine. ?Unusual, cloudy, and bad-smelling urine. ?Pain in the abdomen or lower back. ?Vaginal discharge. ?You may also have: ?Vomiting or a decreased appetite. ?Confusion. ?Irritability or tiredness. ?A fever. ?Diarrhea. ?A low level of red blood cells (anemia). ?The development of high blood pressure during pregnancy (preeclampsia). ?How does this affect my baby? ?An untreated UTI during pregnancy could lead to a kidney infection or an infection throughout the mother's body (systemic infection). This can cause health problems and affect the  baby. Possible complications of an untreated UTI include: ?Your baby being born before 37 weeks of pregnancy (premature). ?Your baby being born with a low birth weight. ?Your baby having a higher risk of having his or her skin or the white parts of the eyes turn yellow (jaundice). ?What can I do to lower my risk? ?To prevent a UTI: ?Do not hold urine for long periods of time. Empty your bladder as soon as you feel the urge. ?Always wipe from front to back, especially after a bowel movement. Use each tissue one time when you wipe. ?Empty your bladder after sex. ?Keep your genital area dry. ?Drink 6 to 8 glasses of water each day. ?Do not douche or use deodorant sprays. ?Wear cotton underwear and loose clothing. ?How is this treated? ?Treatment for this condition may include: ?Antibiotic medicines that are safe to take during pregnancy. ?Other medicines to treat less common causes of UTI. ?Follow these instructions at home: ?If you were prescribed an antibiotic medicine, take it as told by your health care provider. Do not stop using the antibiotic even if you start to feel better. ?Keep all follow-up visits. This is important. ?Contact a health care provider if: ?Your symptoms do not improve or they get worse. ?You have abnormal vaginal discharge. ?Get help right away if you: ?Have a fever. ?Have nausea and vomiting. ?Have back or side pain. ?Have lower belly pain, tightness, or feel contractions in your uterus. ?Have a gush of fluid from your vagina. ?Have blood in your urine. ?Summary ?A UTI is an infection of any part of the urinary tract, which includes the kidneys, ureters, bladder,   and urethra. ?Most urinary tract infections are caused by bacteria in your genital area, around the entrance to your urinary tract (urethra). ?You are more likely to develop a UTI during pregnancy. It is important to recognize and treat UTIs in pregnancy because of the risk of serious complications for both you and your baby. ?If you  were prescribed an antibiotic medicine, take it as told by your health care provider. Do not stop using the antibiotic even if you start to feel better. ?This information is not intended to replace advice given to you by your health care provider. Make sure you discuss any questions you have with your health care provider. ?Document Revised: 10/05/2020 Document Reviewed: 10/05/2020 ?Elsevier Patient Education ? 2023 [employer]vier [employer]. ? ?

## 2022-03-16 NOTE — Progress Notes (Signed)
HPI  Pt presents to the clinic today with c/o urinary urgency, frequency, pain with urination, cloudy urine and low back pain. This started 2 weeks ago. She denies blood in her urine, vaginal irritation, discharge, odor, abnormal vaginal bleeding. She has had chills but denies fever, nausea or vomiting. She has tried Naproxen and AZO OTC with minimal relief of symptoms. She has a history of kidney stones.   Review of Systems  Past Medical History:  Diagnosis Date   Allergy     Family History  Problem Relation Age of Onset   Depression Mother    Alzheimer's disease Mother    Lung cancer Father    Colon polyps Sister    Depression Sister    Lung cancer Maternal Grandfather    Depression Sister     Social History   Socioeconomic History   Marital status: Married    Spouse name: Not on file   Number of children: Not on file   Years of education: Not on file   Highest education level: Not on file  Occupational History   Not on file  Tobacco Use   Smoking status: Never   Smokeless tobacco: Never  Vaping Use   Vaping Use: Never used  Substance and Sexual Activity   Alcohol use: Yes   Drug use: Never   Sexual activity: Yes    Birth control/protection: Surgical    Comment: Partial Hysterectomy  Other Topics Concern   Not on file  Social History Narrative   Not on file   Social Determinants of Health   Financial Resource Strain: Not on file  Food Insecurity: Not on file  Transportation Needs: Not on file  Physical Activity: Not on file  Stress: Not on file  Social Connections: Not on file  Intimate Partner Violence: Not on file    No Known Allergies   Constitutional: Denies fever, malaise, fatigue, headache or abrupt weight changes.   GU: Pt reports urgency, frequency and pain with urination. Denies blood in urine, odor or discharge. Skin: Denies redness, rashes, lesions or ulcercations.   No other specific complaints in a complete review of systems (except as  listed in HPI above).    Objective:   Physical Exam BP 126/84 (BP Location: Right Arm, Patient Position: Sitting, Cuff Size: Normal)   Pulse 82   Temp (!) 96.9 F (36.1 C) (Temporal)   Wt 156 lb (70.8 kg)   SpO2 99%   BMI 28.53 kg/m   Wt Readings from Last 3 Encounters:  04/17/21 157 lb 5.1 oz (71.4 kg)  02/06/21 153 lb 3.2 oz (69.5 kg)  09/14/19 156 lb (70.8 kg)    General: Appears her stated age, overweight, in NAD. Cardiovascular: Normal rate and rhythm. S1,S2 noted.   Pulmonary/Chest: Normal effort and positive vesicular breath sounds. No respiratory distress. No wheezes, rales or ronchi noted.  Abdomen: Soft. Normal bowel sounds. No distention or masses noted.  Tender to palpation over the bladder area. No CVA tenderness.        Assessment & Plan:   Urgency, Frequency, Dysuria, Low Back Pain secondary to UTI:  Urinalysis: moderate lueks, positive nitrites, large blood Will send urine culture eRx sent if for Septra SS BID x 5 days OK to take AZO OTC Drink plenty of fluids  Schedule an appointment for annual exam Webb Silversmith, NP

## 2022-03-16 NOTE — Addendum Note (Signed)
Addended by: Ashley Royalty E on: 03/16/2022 02:31 PM   Modules accepted: Orders

## 2022-03-18 LAB — URINE CULTURE
MICRO NUMBER:: 14425004
SPECIMEN QUALITY:: ADEQUATE

## 2022-04-09 ENCOUNTER — Encounter: Payer: Self-pay | Admitting: Internal Medicine

## 2022-04-09 ENCOUNTER — Ambulatory Visit (INDEPENDENT_AMBULATORY_CARE_PROVIDER_SITE_OTHER): Payer: BC Managed Care – PPO | Admitting: Internal Medicine

## 2022-04-09 VITALS — BP 112/80 | HR 76 | Temp 96.9°F | Ht 62.5 in | Wt 162.0 lb

## 2022-04-09 DIAGNOSIS — R7309 Other abnormal glucose: Secondary | ICD-10-CM

## 2022-04-09 DIAGNOSIS — Z6829 Body mass index (BMI) 29.0-29.9, adult: Secondary | ICD-10-CM

## 2022-04-09 DIAGNOSIS — E7800 Pure hypercholesterolemia, unspecified: Secondary | ICD-10-CM

## 2022-04-09 DIAGNOSIS — M25542 Pain in joints of left hand: Secondary | ICD-10-CM

## 2022-04-09 DIAGNOSIS — M25541 Pain in joints of right hand: Secondary | ICD-10-CM

## 2022-04-09 DIAGNOSIS — Z0001 Encounter for general adult medical examination with abnormal findings: Secondary | ICD-10-CM

## 2022-04-09 DIAGNOSIS — E663 Overweight: Secondary | ICD-10-CM

## 2022-04-09 DIAGNOSIS — Z8261 Family history of arthritis: Secondary | ICD-10-CM

## 2022-04-09 NOTE — Assessment & Plan Note (Signed)
Encourage diet and exercise for weight loss 

## 2022-04-09 NOTE — Patient Instructions (Signed)

## 2022-04-09 NOTE — Progress Notes (Signed)
Subjective:    Patient ID: Rebecca Buchanan, female    DOB: 07-Aug-1970, 52 y.o.   MRN: [id number]  HPI  Patient presents to clinic today for her annual exam.  Flu: Never Tetanus: 02/2021 COVID: Never Shingrix: Never Pap smear: Hysterectomy Mammogram: >2 years ago Colon screening: 04/2021 Vision screening: as needed Dentist: as needed  Diet: She does eat meat. She consumes fruits and veggies. She does eat fried foods. She drinks mostly sweet tea. Exercise: Gym 1 x month  Review of Systems     Past Medical History:  Diagnosis Date   Allergy     Current Outpatient Medications  Medication Sig Dispense Refill   cetirizine (ZYRTEC) 10 MG tablet Take 10 mg by mouth daily as needed.     clobetasol (TEMOVATE) 0.05 % external solution APPLY EVERY DAY 240 mL 1   clobetasol cream (TEMOVATE) 0.05 % APPLY TOPICALLY TWICE DAILY 30 g 0   famotidine (PEPCID) 20 MG tablet Take 20 mg by mouth as needed.      ibuprofen (ADVIL) 200 MG tablet Take 200 mg by mouth daily as needed.     naproxen sodium (ALEVE) 220 MG tablet Take 220 mg by mouth daily as needed.     sulfamethoxazole-trimethoprim (BACTRIM) 400-80 MG tablet Take 1 tablet by mouth 2 (two) times daily. 10 tablet 0   valACYclovir (VALTREX) 500 MG tablet TAKE 1 TABLET BY MOUTH TWICE DAILY FOR 3DAYS DURING OUTBREAK. 30 tablet 1   No current facility-administered medications for this visit.    No Known Allergies  Family History  Problem Relation Age of Onset   Depression Mother    Alzheimer's disease Mother    Lung cancer Father    Colon polyps Sister    Depression Sister    Lung cancer Maternal Grandfather    Depression Sister     Social History   Socioeconomic History   Marital status: Married    Spouse name: Not on file   Number of children: Not on file   Years of education: Not on file   Highest education level: Not on file  Occupational History   Not on file  Tobacco Use   Smoking status: Never   Smokeless  tobacco: Never  Vaping Use   Vaping Use: Never used  Substance and Sexual Activity   Alcohol use: Yes   Drug use: Never   Sexual activity: Yes    Birth control/protection: Surgical    Comment: Partial Hysterectomy  Other Topics Concern   Not on file  Social History Narrative   Not on file   Social Determinants of Health   Financial Resource Strain: Not on file  Food Insecurity: Not on file  Transportation Needs: Not on file  Physical Activity: Not on file  Stress: Not on file  Social Connections: Not on file  Intimate Partner Violence: Not on file     Constitutional: Denies fever, malaise, fatigue, headache or abrupt weight changes.  HEENT: Denies eye pain, eye redness, ear pain, ringing in the ears, wax buildup, runny nose, nasal congestion, bloody nose, or sore throat. Respiratory: Denies difficulty breathing, shortness of breath, cough or sputum production.   Cardiovascular: Denies chest pain, chest tightness, palpitations or swelling in the hands or feet.  Gastrointestinal: Pt reports intermittent constipation, reflux. Denies abdominal pain, bloating, diarrhea or blood in the stool.  GU: Denies urgency, frequency, pain with urination, burning sensation, blood in urine, odor or discharge. Musculoskeletal: Pt reports joint pain in hands. Denies decrease  in range of motion, difficulty with gait, muscle pain or joint swelling.  Skin: Denies redness, rashes, lesions or ulcercations.  Neurological: Denies dizziness, difficulty with memory, difficulty with speech or problems with balance and coordination.  Psych: Patient has a history of depression.  Denies anxiety, SI/HI.  No other specific complaints in a complete review of systems (except as listed in HPI above).  Objective:   Physical Exam  BP 112/80 (BP Location: Right Arm, Patient Position: Sitting, Cuff Size: Normal)   Pulse 76   Temp (!) 96.9 F (36.1 C) (Temporal)   Ht 5' 2.5" (1.588 m)   Wt 162 lb (73.5 kg)    SpO2 97%   BMI 29.16 kg/m   Wt Readings from Last 3 Encounters:  03/16/22 156 lb (70.8 kg)  04/17/21 157 lb 5.1 oz (71.4 kg)  02/06/21 153 lb 3.2 oz (69.5 kg)    General: Appears her stated age, overweight, in NAD. Skin: Warm, dry and intact. No rashes noted. HEENT: Head: normal shape and size; Eyes: sclera white, no icterus, conjunctiva pink, PERRLA and EOMs intact;  Neck:  Neck supple, trachea midline. No masses, lumps or thyromegaly present.  Cardiovascular: Normal rate and rhythm. S1,S2 noted.  No murmur, rubs or gallops noted. No JVD or BLE edema. No carotid bruits noted. Pulmonary/Chest: Normal effort and positive vesicular breath sounds. No respiratory distress. No wheezes, rales or ronchi noted.  Abdomen:  Normal bowel sounds.  Musculoskeletal: Herbdens nodes noted of bilateral hands. Strength 5/5 BUE/BLE. No difficulty with gait.  Neurological: Alert and oriented. Cranial nerves II-XII grossly intact. Coordination normal.  Psychiatric: Mood and affect normal. Behavior is normal. Judgment and thought content normal.    BMET    Component Value Date/Time   NA 141 02/06/2021 1025   K 4.0 02/06/2021 1025   CL 103 02/06/2021 1025   CO2 30 02/06/2021 1025   GLUCOSE 94 02/06/2021 1025   BUN 12 02/06/2021 1025   CREATININE 1.50 (H) 02/06/2021 1025   CALCIUM 9.8 02/06/2021 1025   GFRNONAA >60 09/14/2019 1904   GFRAA >60 09/14/2019 1904    Lipid Panel     Component Value Date/Time   CHOL 276 (H) 02/06/2021 1025   TRIG 107 02/06/2021 1025   HDL 90 02/06/2021 1025   CHOLHDL 3.1 02/06/2021 1025   VLDL 20.8 07/22/2019 1540   LDLCALC 164 (H) 02/06/2021 1025    CBC    Component Value Date/Time   WBC 6.2 02/06/2021 1025   RBC 4.38 02/06/2021 1025   HGB 13.2 02/06/2021 1025   HGB 13.8 03/14/2018 1412   HCT 38.8 02/06/2021 1025   HCT 39.1 03/14/2018 1412   PLT 204 02/06/2021 1025   PLT 228 03/14/2018 1412   MCV 88.6 02/06/2021 1025   MCV 85 03/14/2018 1412   MCH 30.1  02/06/2021 1025   MCHC 34.0 02/06/2021 1025   RDW 13.0 02/06/2021 1025   RDW 12.5 03/14/2018 1412    Hgb A1C Lab Results  Component Value Date   HGBA1C 5.5 02/06/2021            Assessment & Plan:   Preventative Health Maintenance:  She declines flu shot Tetanus UTD Encouraged her to get her COVID-vaccine Discussed Shingrix vaccine, she will check coverage with her insurance company and schedule visit if she would like to have this done She no longer needs cervical cancer screening Mammogram ordered-she will call to schedule Colon screening UTD Encouraged her to consume a balanced diet and exercise regimen  Advised her to see an eye doctor and dentist annually We will check CBC, c-Met, lipid and A1c today  RTC in 6 months, follow-up chronic conditions Webb Silversmith, NP

## 2022-04-11 LAB — CBC
HCT: 37.4 % (ref 35.0–45.0)
Hemoglobin: 12.8 g/dL (ref 11.7–15.5)
MCH: 29.6 pg (ref 27.0–33.0)
MCHC: 34.2 g/dL (ref 32.0–36.0)
MCV: 86.6 fL (ref 80.0–100.0)
MPV: 11 fL (ref 7.5–12.5)
Platelets: 190 10*3/uL (ref 140–400)
RBC: 4.32 10*6/uL (ref 3.80–5.10)
RDW: 12.9 % (ref 11.0–15.0)
WBC: 6.4 10*3/uL (ref 3.8–10.8)

## 2022-04-11 LAB — COMPLETE METABOLIC PANEL WITH GFR
AG Ratio: 1.6 (calc) (ref 1.0–2.5)
ALT: 31 U/L — ABNORMAL HIGH (ref 6–29)
AST: 26 U/L (ref 10–35)
Albumin: 4.4 g/dL (ref 3.6–5.1)
Alkaline phosphatase (APISO): 118 U/L (ref 37–153)
BUN: 13 mg/dL (ref 7–25)
CO2: 25 mmol/L (ref 20–32)
Calcium: 9.5 mg/dL (ref 8.6–10.4)
Chloride: 105 mmol/L (ref 98–110)
Creat: 0.96 mg/dL (ref 0.50–1.03)
Globulin: 2.7 g/dL (calc) (ref 1.9–3.7)
Glucose, Bld: 96 mg/dL (ref 65–99)
Potassium: 4.4 mmol/L (ref 3.5–5.3)
Sodium: 140 mmol/L (ref 135–146)
Total Bilirubin: 0.4 mg/dL (ref 0.2–1.2)
Total Protein: 7.1 g/dL (ref 6.1–8.1)
eGFR: 70 mL/min/{1.73_m2} (ref >=60)

## 2022-04-11 LAB — C-REACTIVE PROTEIN: CRP: 4.2 mg/L (ref <8.0)

## 2022-04-11 LAB — LIPID PANEL
Cholesterol: 292 mg/dL — ABNORMAL HIGH (ref <200)
HDL: 74 mg/dL (ref >=50)
LDL Cholesterol (Calc): 186 mg/dL (calc) — ABNORMAL HIGH
Non-HDL Cholesterol (Calc): 218 mg/dL (calc) — ABNORMAL HIGH (ref <130)
Total CHOL/HDL Ratio: 3.9 (calc) (ref <5.0)
Triglycerides: 169 mg/dL — ABNORMAL HIGH (ref <150)

## 2022-04-11 LAB — SEDIMENTATION RATE: Sed Rate: 14 mm/h (ref 0–30)

## 2022-04-11 LAB — HEMOGLOBIN A1C
Hgb A1c MFr Bld: 5.8 % of total Hgb — ABNORMAL HIGH (ref <5.7)
Mean Plasma Glucose: 120 mg/dL
eAG (mmol/L): 6.6 mmol/L

## 2022-04-11 LAB — RHEUMATOID FACTOR: Rheumatoid fact SerPl-aCnc: <14 IU/mL (ref <14)

## 2022-04-11 LAB — ANTI-NUCLEAR AB-TITER (ANA TITER): ANA Titer 1: 1:40 {titer} — ABNORMAL HIGH

## 2022-04-11 LAB — ANA: Anti Nuclear Antibody (ANA): POSITIVE — AB

## 2022-06-19 ENCOUNTER — Other Ambulatory Visit: Payer: Self-pay | Admitting: Internal Medicine

## 2022-06-19 MED ORDER — CLOBETASOL PROPIONATE 0.05 % EX CREA: TOPICAL_CREAM | Freq: Two times a day (BID) | CUTANEOUS | 0 refills | Status: DC

## 2022-11-21 ENCOUNTER — Other Ambulatory Visit: Payer: Self-pay | Admitting: Internal Medicine

## 2022-11-22 MED ORDER — CLOBETASOL PROPIONATE 0.05 % EX CREA: TOPICAL_CREAM | Freq: Two times a day (BID) | CUTANEOUS | 0 refills | Status: DC

## 2022-12-04 ENCOUNTER — Ambulatory Visit: Payer: BC Managed Care – PPO | Admitting: Internal Medicine

## 2022-12-04 NOTE — Progress Notes (Deleted)
Subjective:    Patient ID: Rebecca Buchanan, female    DOB: 12-Oct-1970, 52 y.o.   MRN: [id number]  HPI  Patient presents to clinic today for follow-up of chronic conditions.  Depression: Chronic however she is not currently taking any medications for this.  She is not currently seeing a therapist.  She denies anxiety, SI/HI.  GERD: Triggered by spicy, tomato-based foods or eating late at night.  She takes famotidine only as needed.  There is no upper GI on file.  IBS: She reports alternating constipation and diarrhea.  She is not currently taking any medications for this.  There is no colonoscopy on file.  Psoriasis: Managed with clobetasol as needed.  She does not follow with dermatology.  History of cold sores: She denies recent flare.  She takes valacyclovir as needed with good results.  HLD: Her last LDL was 186, triglycerides 169, 04/2022.  She is not taking any cholesterol-lowering medication at this time.  She does not consume a low-fat diet.  Prediabetes: Her last A1c was 5.8%.  She is not taking any oral diabetic medication at this time.  She does not check her sugars.  Review of Systems     Past Medical History:  Diagnosis Date   Allergy     Current Outpatient Medications  Medication Sig Dispense Refill   cetirizine (ZYRTEC) 10 MG tablet Take 10 mg by mouth daily as needed.     Cholecalciferol (VITAMIN D3) 250 MCG (10000 UT) capsule Take 10,000 Units by mouth daily.     clobetasol (TEMOVATE) 0.05 % external solution APPLY EVERY DAY 240 mL 1   clobetasol cream (TEMOVATE) 0.05 % Apply topically 2 (two) times daily. 30 g 0   cyanocobalamin (VITAMIN B12) 1000 MCG tablet Take 1,000 mcg by mouth daily.     famotidine (PEPCID) 20 MG tablet Take 20 mg by mouth as needed.      ibuprofen (ADVIL) 200 MG tablet Take 200 mg by mouth daily as needed.     naproxen sodium (ALEVE) 220 MG tablet Take 220 mg by mouth daily as needed.     valACYclovir (VALTREX) 500 MG tablet TAKE 1  TABLET BY MOUTH TWICE DAILY FOR 3DAYS DURING OUTBREAK. 30 tablet 1   No current facility-administered medications for this visit.    No Known Allergies  Family History  Problem Relation Age of Onset   Depression Mother    Alzheimer's disease Mother    Lung cancer Father    Colon polyps Sister    Depression Sister    Depression Sister    Lung cancer Maternal Grandfather    Colon cancer Neg Hx    Breast cancer Neg Hx     Social History   Socioeconomic History   Marital status: Married    Spouse name: Not on file   Number of children: Not on file   Years of education: Not on file   Highest education level: Not on file  Occupational History   Not on file  Tobacco Use   Smoking status: Never   Smokeless tobacco: Never  Vaping Use   Vaping status: Never Used  Substance and Sexual Activity   Alcohol use: Yes   Drug use: Never   Sexual activity: Yes    Birth control/protection: Surgical    Comment: Partial Hysterectomy  Other Topics Concern   Not on file  Social History Narrative   Not on file   Social Determinants of Health   Financial Resource Strain: Not  on file  Food Insecurity: Not on file  Transportation Needs: Not on file  Physical Activity: Not on file  Stress: Not on file  Social Connections: Not on file  Intimate Partner Violence: Not on file     Constitutional: Denies fever, malaise, fatigue, headache or abrupt weight changes.  HEENT: Denies eye pain, eye redness, ear pain, ringing in the ears, wax buildup, runny nose, nasal congestion, bloody nose, or sore throat. Respiratory: Denies difficulty breathing, shortness of breath, cough or sputum production.   Cardiovascular: Denies chest pain, chest tightness, palpitations or swelling in the hands or feet.  Gastrointestinal: Patient reports alternating constipation and diarrhea.  Denies abdominal pain, bloating, or blood in the stool.  GU: Denies urgency, frequency, pain with urination, burning  sensation, blood in urine, odor or discharge. Musculoskeletal: Denies decrease in range of motion, difficulty with gait, muscle pain or joint pain and swelling.  Skin: Denies redness, rashes, lesions or ulcercations.  Neurological: Denies dizziness, difficulty with memory, difficulty with speech or problems with balance and coordination.  Psych: Patient has a history of depression.  Denies anxiety, SI/HI.  No other specific complaints in a complete review of systems (except as listed in HPI above).  Objective:   Physical Exam   There were no vitals taken for this visit. Wt Readings from Last 3 Encounters:  04/09/22 162 lb (73.5 kg)  03/16/22 156 lb (70.8 kg)  04/17/21 157 lb 5.1 oz (71.4 kg)    General: Appears their stated age, well developed, well nourished in NAD. Skin: Warm, dry and intact. No rashes, lesions or ulcerations noted. HEENT: Head: normal shape and size; Eyes: sclera white, no icterus, conjunctiva pink, PERRLA and EOMs intact; Ears: Tm's gray and intact, normal light reflex; Nose: mucosa pink and moist, septum midline; Throat/Mouth: Teeth present, mucosa pink and moist, no exudate, lesions or ulcerations noted.  Neck:  Neck supple, trachea midline. No masses, lumps or thyromegaly present.  Cardiovascular: Normal rate and rhythm. S1,S2 noted.  No murmur, rubs or gallops noted. No JVD or BLE edema. No carotid bruits noted. Pulmonary/Chest: Normal effort and positive vesicular breath sounds. No respiratory distress. No wheezes, rales or ronchi noted.  Abdomen: Soft and nontender. Normal bowel sounds. No distention or masses noted. Liver, spleen and kidneys non palpable. Musculoskeletal: Normal range of motion. No signs of joint swelling. No difficulty with gait.  Neurological: Alert and oriented. Cranial nerves II-XII grossly intact. Coordination normal.  Psychiatric: Mood and affect normal. Behavior is normal. Judgment and thought content normal.     BMET    Component  Value Date/Time   NA 140 04/09/2022 0844   K 4.4 04/09/2022 0844   CL 105 04/09/2022 0844   CO2 25 04/09/2022 0844   GLUCOSE 96 04/09/2022 0844   BUN 13 04/09/2022 0844   CREATININE 0.96 04/09/2022 0844   CALCIUM 9.5 04/09/2022 0844   GFRNONAA >60 09/14/2019 1904   GFRAA >60 09/14/2019 1904    Lipid Panel     Component Value Date/Time   CHOL 292 (H) 04/09/2022 0844   TRIG 169 (H) 04/09/2022 0844   HDL 74 04/09/2022 0844   CHOLHDL 3.9 04/09/2022 0844   VLDL 20.8 07/22/2019 1540   LDLCALC 186 (H) 04/09/2022 0844    CBC    Component Value Date/Time   WBC 6.4 04/09/2022 0844   RBC 4.32 04/09/2022 0844   HGB 12.8 04/09/2022 0844   HGB 13.8 03/14/2018 1412   HCT 37.4 04/09/2022 0844   HCT 39.1  03/14/2018 1412   PLT 190 04/09/2022 0844   PLT 228 03/14/2018 1412   MCV 86.6 04/09/2022 0844   MCV 85 03/14/2018 1412   MCH 29.6 04/09/2022 0844   MCHC 34.2 04/09/2022 0844   RDW 12.9 04/09/2022 0844   RDW 12.5 03/14/2018 1412    Hgb A1C Lab Results  Component Value Date   HGBA1C 5.8 (H) 04/09/2022           Assessment & Plan:     RTC in 4 months for your annual exam Nicki Reaper, NP

## 2023-03-25 ENCOUNTER — Telehealth: Payer: BC Managed Care – PPO | Admitting: Family Medicine

## 2023-03-25 DIAGNOSIS — B001 Herpesviral vesicular dermatitis: Secondary | ICD-10-CM

## 2023-03-25 MED ORDER — VALACYCLOVIR HCL 1 G PO TABS: 2000.0000 mg | ORAL_TABLET | Freq: Two times a day (BID) | ORAL | 0 refills | Status: AC

## 2023-03-25 NOTE — Progress Notes (Signed)
 E-Visit for [employer]  We are sorry that you are not feeling well.  Here is how we plan to help!  Based on what you have shared with me it does look like you have a viral infection.    Most cold sores or fever blisters are small fluid filled blisters around the mouth caused by herpes simplex virus.  The most common strain of the virus causing cold sores is herpes simplex virus 1.  It can be spread by skin contact, sharing eating utensils, or even sharing towels.  Cold sores are contagious to other people until dry. (Approximately 5-7 days).  Wash your hands. You can spread the virus to your eyes through handling your contact lenses after touching the lesions.  Most people experience pain at the sight or tingling sensations in their lips that may begin before the ulcers erupt.  Herpes simplex is treatable but not curable.  It may lie dormant for a long time and then reappear due to stress or prolonged sun exposure.  Many patients have success in treating their cold sores with an over the counter topical called Abreva.  You may apply the cream up to 5 times daily (maximum 10 days) until healing occurs.  If you would like to use an oral antiviral medication to speed the healing of your cold sore, I have sent a prescription to your local pharmacy Valacyclovir 2 gm take one by mouth twice a day for 1 day    HOME CARE:  Wash your hands frequently. Do not pick at or rub the sore. Don't open the blisters. Avoid kissing other people during this time. Avoid sharing drinking glasses, eating utensils, or razors. Do not handle contact lenses unless you have thoroughly washed your hands with soap and warm water! Avoid oral sex during this time.  Herpes from sores on your mouth can spread to your partner's genital area. Avoid contact with anyone who has eczema or a weakened immune system. Cold sores are often triggered by exposure to intense sunlight, use a lip balm containing a sunscreen (SPF 30 or  higher).  GET HELP RIGHT AWAY IF:  Blisters look infected. Blisters occur near or in the eye. Symptoms last longer than 10 days. Your symptoms become worse.  MAKE SURE YOU:  Understand these instructions. Will watch your condition. Will get help right away if you are not doing well or get worse.    Your e-visit answers were reviewed by a board certified advanced clinical practitioner to complete your personal care plan.  Depending upon the condition, your plan could have  Included both over the counter or prescription medications.    Please review your pharmacy choice.  Be sure that the pharmacy you have chosen is open so that you can pick up your prescription now.  If there is a problem you can message your provider in MyChart to have the prescription routed to another pharmacy.    Your safety is important to [place].  If you have drug allergies check our prescription carefully.  For the next 24 hours you can use MyChart to ask questions about today's visit, request a non-urgent call back, or ask for a work or school excuse from your e-visit provider.  You will get an email in the next two days asking about your experience.  I hope that your e-visit has been valuable and will speed your recovery.  I provided 5 minutes of non face-to-face time during this encounter for chart review, medication and order placement,  as well as and documentation.

## 2023-03-27 ENCOUNTER — Other Ambulatory Visit: Payer: Self-pay | Admitting: Internal Medicine

## 2023-03-27 MED ORDER — CLOBETASOL PROPIONATE 0.05 % EX CREA: TOPICAL_CREAM | Freq: Two times a day (BID) | CUTANEOUS | 0 refills | Status: AC

## 2023-06-13 ENCOUNTER — Encounter: Payer: Self-pay | Admitting: Internal Medicine

## 2023-06-13 MED ORDER — SCOPOLAMINE 1 MG/3DAYS TD PT72: 1.0000 | MEDICATED_PATCH | TRANSDERMAL | 0 refills | Status: AC

## 2023-07-04 ENCOUNTER — Emergency Department

## 2023-07-04 ENCOUNTER — Other Ambulatory Visit: Payer: Self-pay

## 2023-07-04 ENCOUNTER — Emergency Department: Admission: EM | Admit: 2023-07-04 | Discharge: 2023-07-04 | Disposition: A | Discharge status: Home / Self Care

## 2023-07-04 DIAGNOSIS — M25551 Pain in right hip: Secondary | ICD-10-CM | POA: Insufficient documentation

## 2023-07-04 DIAGNOSIS — S3992XA Unspecified injury of lower back, initial encounter: Secondary | ICD-10-CM | POA: Diagnosis not present

## 2023-07-04 DIAGNOSIS — S39012A Strain of muscle, fascia and tendon of lower back, initial encounter: Secondary | ICD-10-CM | POA: Diagnosis not present

## 2023-07-04 DIAGNOSIS — M47814 Spondylosis without myelopathy or radiculopathy, thoracic region: Secondary | ICD-10-CM | POA: Diagnosis not present

## 2023-07-04 DIAGNOSIS — G44209 Tension-type headache, unspecified, not intractable: Secondary | ICD-10-CM | POA: Insufficient documentation

## 2023-07-04 DIAGNOSIS — M25532 Pain in left wrist: Secondary | ICD-10-CM | POA: Insufficient documentation

## 2023-07-04 DIAGNOSIS — I10 Essential (primary) hypertension: Secondary | ICD-10-CM | POA: Diagnosis not present

## 2023-07-04 DIAGNOSIS — Y92410 Unspecified street and highway as the place of occurrence of the external cause: Secondary | ICD-10-CM | POA: Insufficient documentation

## 2023-07-04 DIAGNOSIS — Z041 Encounter for examination and observation following transport accident: Secondary | ICD-10-CM | POA: Diagnosis not present

## 2023-07-04 MED ORDER — MELOXICAM 15 MG PO TABS: 15.0000 mg | ORAL_TABLET | Freq: Every day | ORAL | 0 refills | Status: AC

## 2023-07-04 MED ORDER — IBUPROFEN 800 MG PO TABS
800.0000 mg | ORAL_TABLET | Freq: Once | ORAL | Status: AC
  Administered 2023-07-04: 800 mg via ORAL
  Filled 2023-07-04: qty 1

## 2023-07-04 MED ORDER — CYCLOBENZAPRINE HCL 10 MG PO TABS: 10.0000 mg | ORAL_TABLET | Freq: Three times a day (TID) | ORAL | 0 refills | Status: AC | PRN
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
## 2023-07-25 ENCOUNTER — Ambulatory Visit: Admitting: Internal Medicine
